# Patient Record
Sex: Male | Born: 1950 | Race: Black or African American | Hispanic: No | Marital: Single | Smoking: Former smoker
Health system: Southern US, Community
[De-identification: ages and names within clinical notes are randomized; demographics above are authoritative.]

## PROBLEM LIST (undated history)

## (undated) DIAGNOSIS — Z972 Presence of dental prosthetic device (complete) (partial): Secondary | ICD-10-CM

## (undated) DIAGNOSIS — Z8679 Personal history of other diseases of the circulatory system: Secondary | ICD-10-CM

## (undated) DIAGNOSIS — I10 Essential (primary) hypertension: Secondary | ICD-10-CM

## (undated) DIAGNOSIS — M1990 Unspecified osteoarthritis, unspecified site: Secondary | ICD-10-CM

## (undated) DIAGNOSIS — R3911 Hesitancy of micturition: Secondary | ICD-10-CM

## (undated) DIAGNOSIS — J302 Other seasonal allergic rhinitis: Secondary | ICD-10-CM

## (undated) DIAGNOSIS — E785 Hyperlipidemia, unspecified: Secondary | ICD-10-CM

## (undated) DIAGNOSIS — M5382 Other specified dorsopathies, cervical region: Secondary | ICD-10-CM

## (undated) DIAGNOSIS — I639 Cerebral infarction, unspecified: Secondary | ICD-10-CM

## (undated) DIAGNOSIS — IMO0002 Reserved for concepts with insufficient information to code with codable children: Secondary | ICD-10-CM

## (undated) HISTORY — DX: Reserved for concepts with insufficient information to code with codable children: IMO0002

## (undated) HISTORY — DX: Hyperlipidemia, unspecified: E78.5

## (undated) HISTORY — DX: Essential (primary) hypertension: I10

---

## 1998-05-21 ENCOUNTER — Inpatient Hospital Stay (HOSPITAL_COMMUNITY): Admission: EM | Admit: 1998-05-21 | Discharge: 1998-05-22 | Payer: Self-pay | Admitting: Emergency Medicine

## 1998-05-21 ENCOUNTER — Encounter: Payer: Self-pay | Admitting: Emergency Medicine

## 1998-06-06 ENCOUNTER — Encounter: Admission: RE | Admit: 1998-06-06 | Discharge: 1998-06-06 | Payer: Self-pay | Admitting: Family Medicine

## 1998-06-19 ENCOUNTER — Encounter: Admission: RE | Admit: 1998-06-19 | Discharge: 1998-06-19 | Payer: Self-pay | Admitting: Family Medicine

## 1998-07-11 ENCOUNTER — Ambulatory Visit (HOSPITAL_COMMUNITY): Admission: RE | Admit: 1998-07-11 | Discharge: 1998-07-11 | Payer: Self-pay | Admitting: Sports Medicine

## 1998-07-12 ENCOUNTER — Encounter: Payer: Self-pay | Admitting: Emergency Medicine

## 1998-07-12 ENCOUNTER — Emergency Department (HOSPITAL_COMMUNITY): Admission: EM | Admit: 1998-07-12 | Discharge: 1998-07-12 | Payer: Self-pay | Admitting: Emergency Medicine

## 2001-04-25 ENCOUNTER — Encounter: Payer: Self-pay | Admitting: Emergency Medicine

## 2001-04-25 ENCOUNTER — Emergency Department (HOSPITAL_COMMUNITY): Admission: EM | Admit: 2001-04-25 | Discharge: 2001-04-25 | Payer: Self-pay | Admitting: Emergency Medicine

## 2002-10-26 ENCOUNTER — Emergency Department (HOSPITAL_COMMUNITY): Admission: EM | Admit: 2002-10-26 | Discharge: 2002-10-26 | Payer: Self-pay | Admitting: Emergency Medicine

## 2003-02-28 ENCOUNTER — Emergency Department (HOSPITAL_COMMUNITY): Admission: EM | Admit: 2003-02-28 | Discharge: 2003-02-28 | Payer: Self-pay | Admitting: *Deleted

## 2003-02-28 ENCOUNTER — Encounter: Payer: Self-pay | Admitting: Emergency Medicine

## 2004-05-08 ENCOUNTER — Emergency Department (HOSPITAL_COMMUNITY): Admission: EM | Admit: 2004-05-08 | Discharge: 2004-05-08 | Payer: Self-pay | Admitting: Family Medicine

## 2005-10-29 DIAGNOSIS — Z87820 Personal history of traumatic brain injury: Secondary | ICD-10-CM

## 2005-10-29 DIAGNOSIS — Z8673 Personal history of transient ischemic attack (TIA), and cerebral infarction without residual deficits: Secondary | ICD-10-CM

## 2005-10-29 DIAGNOSIS — R079 Chest pain, unspecified: Secondary | ICD-10-CM | POA: Diagnosis present

## 2005-10-29 DIAGNOSIS — I639 Cerebral infarction, unspecified: Secondary | ICD-10-CM

## 2005-10-29 DIAGNOSIS — Z8679 Personal history of other diseases of the circulatory system: Secondary | ICD-10-CM

## 2005-10-29 HISTORY — DX: Personal history of traumatic brain injury: Z87.820

## 2005-10-29 HISTORY — DX: Cerebral infarction, unspecified: I63.9

## 2005-10-29 HISTORY — DX: Personal history of other diseases of the circulatory system: Z86.79

## 2005-11-08 ENCOUNTER — Inpatient Hospital Stay (HOSPITAL_COMMUNITY): Admission: AC | Admit: 2005-11-08 | Discharge: 2005-12-15 | Payer: Self-pay

## 2005-11-09 HISTORY — PX: INSERTION OF VENA CAVA FILTER: SHX5871

## 2005-11-16 HISTORY — PX: ANTERIOR CERVICAL DECOMP/DISCECTOMY FUSION: SHX1161

## 2005-11-17 ENCOUNTER — Encounter: Payer: Self-pay | Admitting: Vascular Surgery

## 2005-11-19 ENCOUNTER — Ambulatory Visit: Payer: Self-pay | Admitting: Physical Medicine & Rehabilitation

## 2006-10-26 ENCOUNTER — Encounter: Admission: RE | Admit: 2006-10-26 | Discharge: 2006-10-26 | Payer: Self-pay | Admitting: Cardiology

## 2006-11-18 ENCOUNTER — Encounter: Admission: RE | Admit: 2006-11-18 | Discharge: 2007-02-16 | Payer: Self-pay | Admitting: Cardiology

## 2007-01-13 ENCOUNTER — Ambulatory Visit (HOSPITAL_COMMUNITY): Admission: RE | Admit: 2007-01-13 | Discharge: 2007-01-13 | Payer: Self-pay | Admitting: Gastroenterology

## 2007-01-24 IMAGING — CR DG HUMERUS 2V *R*
3 series · 3 of 3 positions shown · non-contrast
Comparison: none

CLINICAL DATA: 54 year-old, right humeral pain
 RIGHT HUMERUS ? 2 VIEW:
 There is no evidence of fracture or other focal bone lesions.  Soft tissues are unremarkable.

[view not recorded (1 of 3)]
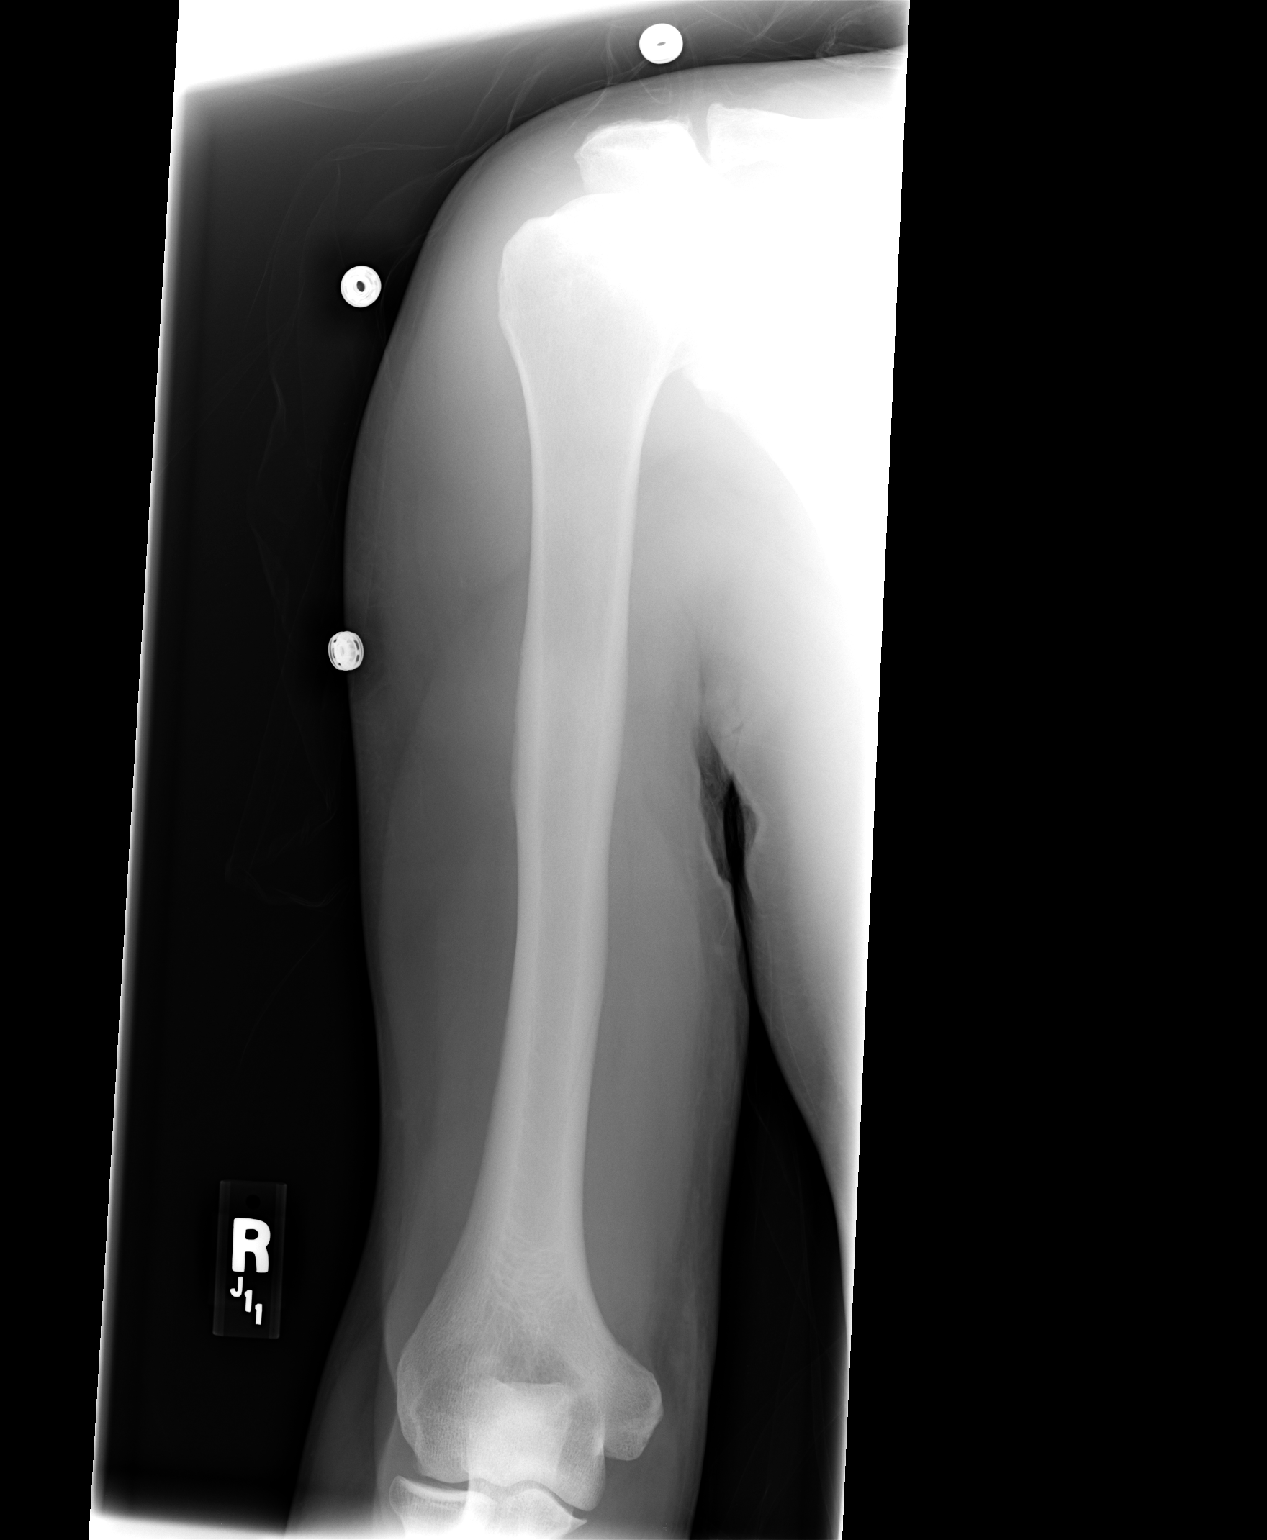

[view not recorded (2 of 3)]
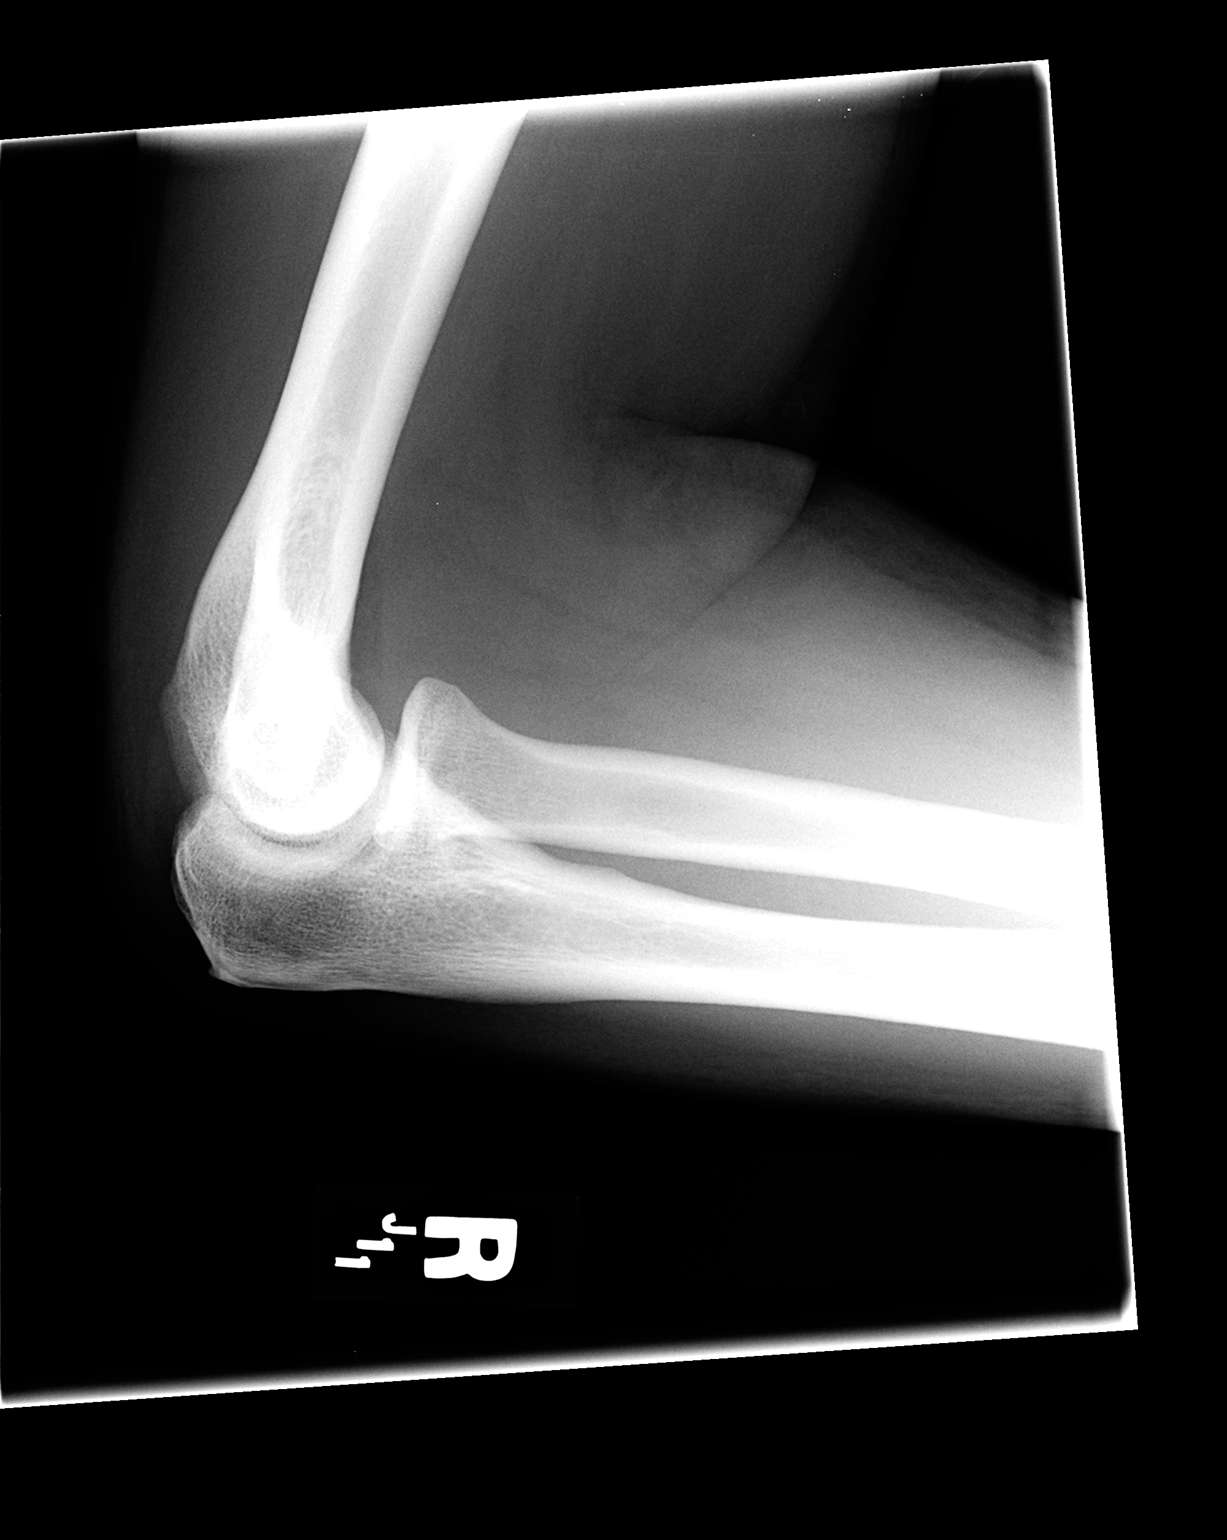

[view not recorded (3 of 3)]
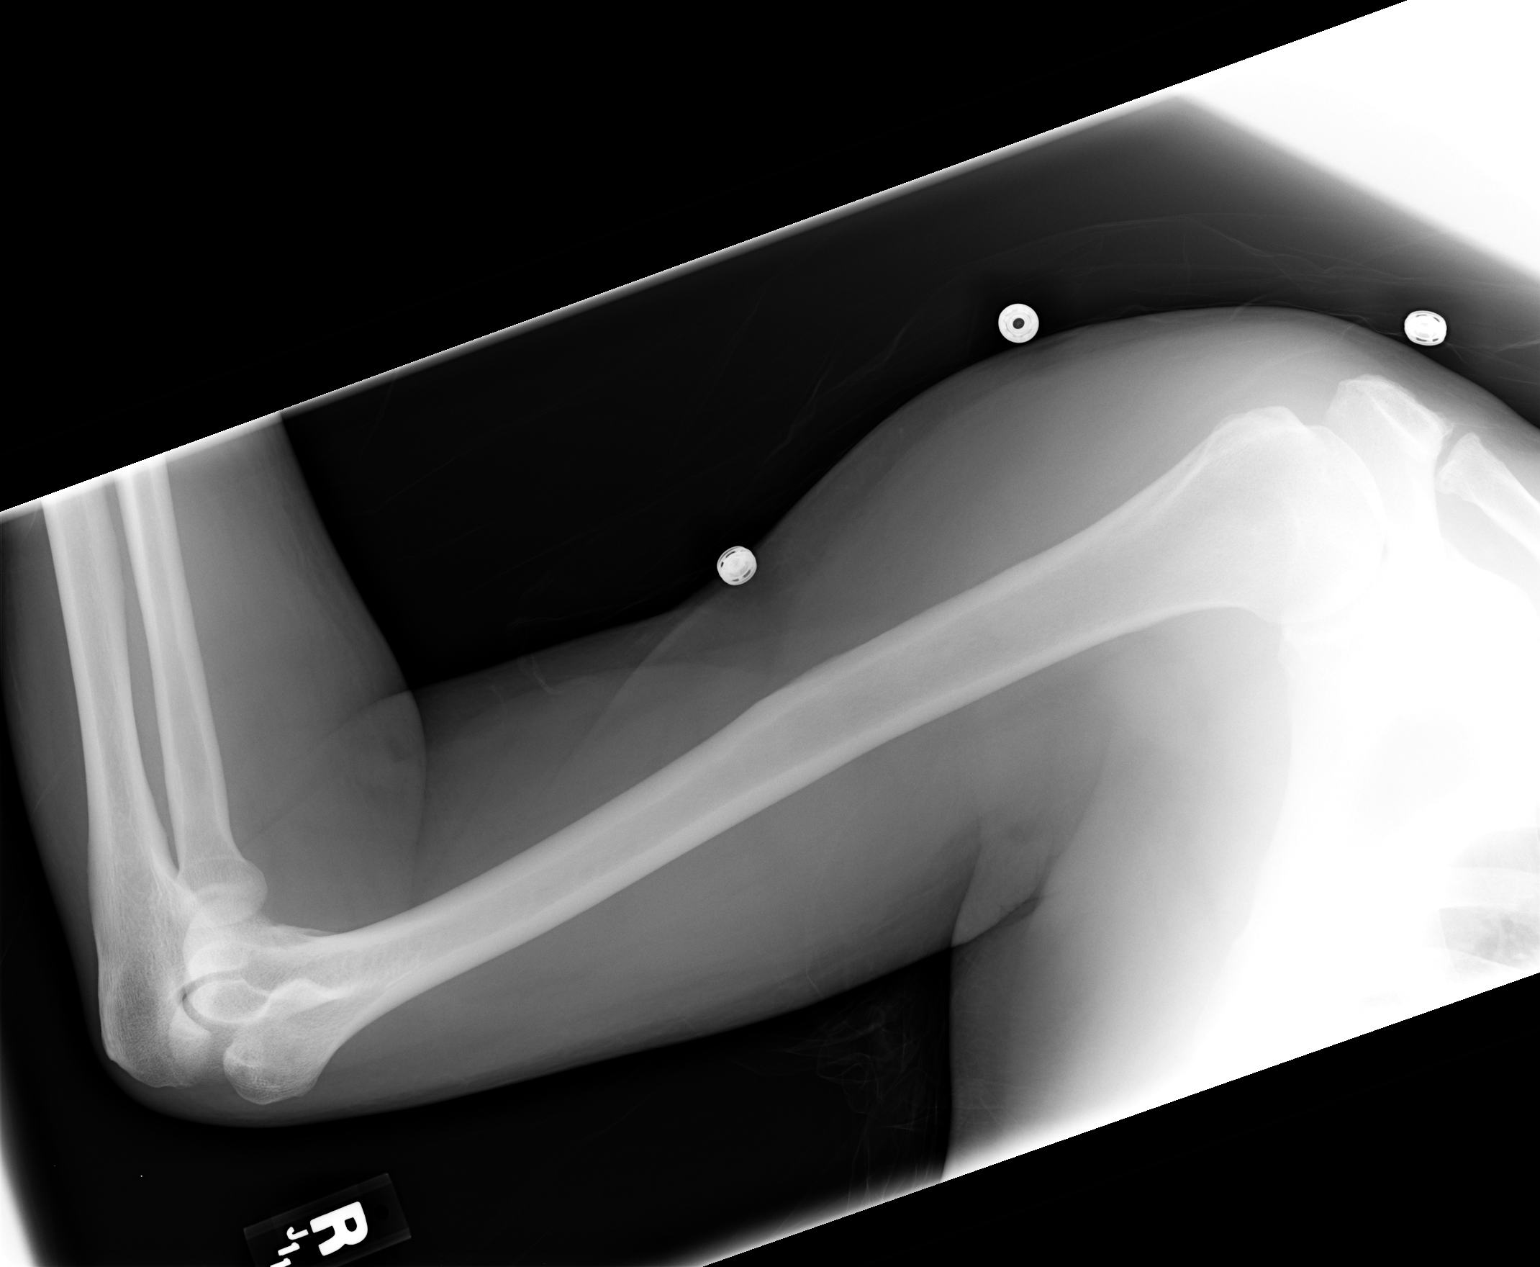

[3 of 3 positions shown; findings below may reference images not displayed]

IMPRESSION: Negative.
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
## 2007-03-10 ENCOUNTER — Encounter: Admission: RE | Admit: 2007-03-10 | Discharge: 2007-05-31 | Payer: Self-pay | Admitting: Cardiology

## 2007-04-28 ENCOUNTER — Other Ambulatory Visit: Payer: Self-pay

## 2007-04-28 ENCOUNTER — Inpatient Hospital Stay: Payer: Self-pay | Admitting: Internal Medicine

## 2007-08-10 ENCOUNTER — Encounter (INDEPENDENT_AMBULATORY_CARE_PROVIDER_SITE_OTHER): Payer: Self-pay | Admitting: Cardiology

## 2007-08-10 ENCOUNTER — Ambulatory Visit (HOSPITAL_COMMUNITY): Admission: RE | Admit: 2007-08-10 | Discharge: 2007-08-10 | Payer: Self-pay | Admitting: Cardiology

## 2007-08-10 ENCOUNTER — Ambulatory Visit: Payer: Self-pay | Admitting: Vascular Surgery

## 2008-07-08 ENCOUNTER — Ambulatory Visit: Payer: Self-pay | Admitting: Oncology

## 2008-08-29 ENCOUNTER — Ambulatory Visit: Payer: Self-pay | Admitting: Oncology

## 2008-09-18 ENCOUNTER — Ambulatory Visit: Payer: Self-pay | Admitting: Urology

## 2008-10-14 ENCOUNTER — Encounter: Payer: Self-pay | Admitting: Oncology

## 2008-10-14 ENCOUNTER — Other Ambulatory Visit: Admission: RE | Admit: 2008-10-14 | Discharge: 2008-10-14 | Payer: Self-pay | Admitting: Oncology

## 2008-10-14 ENCOUNTER — Ambulatory Visit: Payer: Self-pay | Admitting: Oncology

## 2008-10-14 LAB — CMP (CANCER CENTER ONLY)
ALT(SGPT): 16 U/L (ref 10–47)
Albumin: 3.8 g/dL (ref 3.3–5.5)
BUN, Bld: 11 mg/dL (ref 7–22)
CO2: 26 mEq/L (ref 18–33)
Calcium: 9.4 mg/dL (ref 8.0–10.3)
Chloride: 100 mEq/L (ref 98–108)
Creat: 1 mg/dl (ref 0.6–1.2)
Potassium: 4.3 mEq/L (ref 3.3–4.7)

## 2008-10-14 LAB — CBC WITH DIFFERENTIAL (CANCER CENTER ONLY)
BASO#: 0 10*3/uL (ref 0.0–0.2)
EOS%: 2.6 % (ref 0.0–7.0)
Eosinophils Absolute: 0.1 10*3/uL (ref 0.0–0.5)
HCT: 39.5 % (ref 38.7–49.9)
HGB: 13.6 g/dL (ref 13.0–17.1)
MCH: 30.1 pg (ref 28.0–33.4)
MCHC: 34.4 g/dL (ref 32.0–35.9)
MCV: 88 fL (ref 82–98)
MONO%: 9.6 % (ref 0.0–13.0)
NEUT#: 1.1 10*3/uL — ABNORMAL LOW (ref 1.5–6.5)
NEUT%: 34.1 % — ABNORMAL LOW (ref 40.0–80.0)
RBC: 4.51 10*6/uL (ref 4.20–5.70)

## 2008-10-14 LAB — MORPHOLOGY - CHCC SATELLITE: RBC Comments: NORMAL

## 2008-10-14 LAB — CHCC SATELLITE - SMEAR

## 2008-10-29 ENCOUNTER — Ambulatory Visit: Payer: Self-pay | Admitting: Oncology

## 2008-11-13 LAB — CBC WITH DIFFERENTIAL (CANCER CENTER ONLY)
BASO#: 0 10*3/uL (ref 0.0–0.2)
EOS%: 2.9 % (ref 0.0–7.0)
Eosinophils Absolute: 0.1 10*3/uL (ref 0.0–0.5)
HCT: 38.7 % (ref 38.7–49.9)
HGB: 13.3 g/dL (ref 13.0–17.1)
LYMPH#: 1.8 10*3/uL (ref 0.9–3.3)
MCHC: 34.4 g/dL (ref 32.0–35.9)
MONO#: 0.4 10*3/uL (ref 0.1–0.9)
NEUT#: 1.1 10*3/uL — ABNORMAL LOW (ref 1.5–6.5)
NEUT%: 32.2 % — ABNORMAL LOW (ref 40.0–80.0)
RBC: 4.48 10*6/uL (ref 4.20–5.70)
WBC: 3.4 10*3/uL — ABNORMAL LOW (ref 4.0–10.0)

## 2009-05-09 ENCOUNTER — Ambulatory Visit: Payer: Self-pay | Admitting: Oncology

## 2009-05-14 LAB — CBC WITH DIFFERENTIAL (CANCER CENTER ONLY)
BASO%: 0.6 % (ref 0.0–2.0)
EOS%: 3.1 % (ref 0.0–7.0)
HCT: 40.7 % (ref 38.7–49.9)
LYMPH#: 1.7 10*3/uL (ref 0.9–3.3)
MCHC: 32.7 g/dL (ref 32.0–35.9)
MONO#: 0.3 10*3/uL (ref 0.1–0.9)
NEUT#: 1.1 10*3/uL — ABNORMAL LOW (ref 1.5–6.5)
NEUT%: 35 % — ABNORMAL LOW (ref 40.0–80.0)
RDW: 13.7 % (ref 10.5–14.6)
WBC: 3.3 10*3/uL — ABNORMAL LOW (ref 4.0–10.0)

## 2009-12-09 ENCOUNTER — Ambulatory Visit: Payer: Self-pay | Admitting: Oncology

## 2010-01-09 ENCOUNTER — Ambulatory Visit: Payer: Self-pay | Admitting: Oncology

## 2010-01-15 LAB — CBC WITH DIFFERENTIAL (CANCER CENTER ONLY)
Eosinophils Absolute: 0.1 10*3/uL (ref 0.0–0.5)
LYMPH#: 0.8 10*3/uL — ABNORMAL LOW (ref 0.9–3.3)
MONO#: 0.8 10*3/uL (ref 0.1–0.9)
NEUT#: 7.7 10*3/uL — ABNORMAL HIGH (ref 1.5–6.5)
Platelets: 220 10*3/uL (ref 145–400)

## 2010-10-13 NOTE — Op Note (Signed)
NAMEAVIS, MCMAHILL                ACCOUNT NO.:  [id number]   MEDICAL RECORD NO.:  [id number]          PATIENT TYPE:  AMB   LOCATION:  ENDO                         FACILITY:  [facility]   PHYSICIAN:  Shirley Friar, MDDATE OF BIRTH:  09-26-1950   DATE OF PROCEDURE:  DATE OF DISCHARGE:                               OPERATIVE REPORT   INDICATIONS FOR PROCEDURE:  Screening.   MEDICATIONS:  Fentanyl 100 mcg IV, Versed 8 mg IV.   FINDINGS:  A rectal exam was normal. An adult Pentax colonoscope was  inserted  into an adequately prepped colon and advanced to the cecum and  the ileocecal valve and appendiceal orifice were identified. In order to  reach the cecum, abdominal pressure had to be applied. On careful  withdrawal of the colonoscope, no mucosal abnormalities were seen.  Retroflexion was unremarkable.   ASSESSMENT:  Normal colonoscopy.   PLAN:  Repeat colonoscopy in 10 years.      Shirley Friar, MD  Electronically Signed     VCS/MEDQ  D:  01/13/2007  T:  01/14/2007  Job:  [id number]
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
## 2010-10-16 NOTE — Op Note (Signed)
[name], Brandon                ACCOUNT NO.:  [id number]   MEDICAL RECORD NO.:  [id number]          PATIENT TYPE:  INP   LOCATION:  2106                         FACILITY:  [facility]   PHYSICIAN:  Payton Doughty, M.D.      DATE OF BIRTH:  [birth year]   DATE OF PROCEDURE:  11/16/2005  DATE OF DISCHARGE:                                 OPERATIVE REPORT   PREOPERATIVE DIAGNOSIS:  C5-6 traumatic disk with facet fracture.   POSTOPERATIVE DIAGNOSIS:  C5-6 traumatic disk with facet fracture.   OPERATIVE PROCEDURE:  C5-6 anterior cervical decompression and fusion with  Reflex hybrid plate.   SURGEON:  Payton Doughty, M.D.   NURSE ASSISTANT:  [facility].   DOCTOR ASSISTANT:  Clydene Fake, M.D.   ANESTHESIA:  General endotracheal.   PREPARATION:  Sterile Betadine prep and scrub with alcohol wipe.   COMPLICATIONS:  None.   BODY OF TEXT:  A 60 year old gentleman with C5-6 facet fracture and  traumatic disk disruption and with unstable spine.  Taken to the operating  room and smoothly anesthetized and intubated, placed supine on the operating  table.  Following shave, prep and drape in the usual sterile fashion, the  skin was incised from the midline to the medial border of the  sternocleidomastoid muscle on the left side.  The platysma was identified,  elevated, divided and undermined.  The sternocleidomastoid was identified  and medial dissection revealed the carotid artery retracted laterally to the  left, trachea and esophagus retracted laterally to the right, exposing the  bones of the anterior cervical spine.  A marker was placed, intraoperative x-  ray obtained to confirm correctness of level.  Having confirmed correctness  of level, the annulus was opened and the disk removed under gross  observation.  The end plates were curetted.  There was very gentle  distraction placed on the disk space, which allowed opening and the  microscope brought in for inspection of the anterior epidural  space.  The  posterior longitudinal ligament was explored and there was nothing behind  it.  It was felt nothing would be gained by removing the ligament, so it was  left.  An 8 mm bone graft was fashioned from patellar allograft and tapped  into place and a 14 mm Reflex hybrid plate was placed across it with 12 mm  screws, 2 in C5, 2 in C6.  The upper screws were used to interface between  the bone graft and the native bone at C5.  The wound was irrigated,  hemostasis assured.  X-ray confirmed good placement of plate and screws and  bone graft.  The platysma and subcutaneous tissues were reapproximated with  3-  0 Vicryl in interrupted fashion.  The skin was closed with 4-0 Vicryl in a  running subcuticular fashion.  Benzoin and Steri-Strips were placed and made  occlusive with Telfa and OpSite.  The patient placed in Aspen collar and  returned to the ICU still intubated.           ______________________________  Payton Doughty, M.D.  MWR/MEDQ  D:  11/16/2005  T:  11/17/2005  Job:  [id number]

## 2010-10-29 ENCOUNTER — Encounter: Payer: Self-pay | Admitting: Internal Medicine

## 2010-10-30 ENCOUNTER — Encounter: Payer: Self-pay | Admitting: Internal Medicine

## 2010-11-29 ENCOUNTER — Encounter: Payer: Self-pay | Admitting: Internal Medicine

## 2010-12-30 ENCOUNTER — Encounter: Payer: Self-pay | Admitting: Internal Medicine

## 2011-01-30 ENCOUNTER — Encounter: Payer: Self-pay | Admitting: Internal Medicine

## 2011-03-01 ENCOUNTER — Encounter: Payer: Self-pay | Admitting: Internal Medicine

## 2011-08-23 ENCOUNTER — Encounter: Payer: Self-pay | Admitting: Internal Medicine

## 2011-08-30 ENCOUNTER — Encounter: Payer: Self-pay | Admitting: Internal Medicine

## 2011-09-29 ENCOUNTER — Encounter: Payer: Self-pay | Admitting: Internal Medicine

## 2012-07-24 ENCOUNTER — Encounter: Payer: Self-pay | Admitting: Internal Medicine

## 2012-07-29 ENCOUNTER — Encounter: Payer: Self-pay | Admitting: Internal Medicine

## 2012-08-29 ENCOUNTER — Encounter: Payer: Self-pay | Admitting: Internal Medicine

## 2012-08-29 DIAGNOSIS — M1990 Unspecified osteoarthritis, unspecified site: Secondary | ICD-10-CM

## 2012-08-29 HISTORY — DX: Unspecified osteoarthritis, unspecified site: M19.90

## 2012-09-18 ENCOUNTER — Other Ambulatory Visit: Payer: Self-pay | Admitting: Orthopedic Surgery

## 2012-09-21 ENCOUNTER — Encounter (HOSPITAL_BASED_OUTPATIENT_CLINIC_OR_DEPARTMENT_OTHER): Payer: Self-pay | Admitting: *Deleted

## 2012-09-22 NOTE — Pre-Procedure Instructions (Signed)
History discussed with Dr. Crews; pt. OK to come for surgery 

## 2012-09-25 NOTE — H&P (Signed)
Brandon [name] is an 62 y.o. male.   CC / Reason for Visit: Left wrist pain HPI: This patient is a 62 year old male who presents for evaluation of left wrist pain.  He has severe right-sided weakness, making his left arm primary.  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  More recently it has been troublesome using a cane, etc. in the left hand.  He has had the wrist injected more than once in the past and finds that it is helpful but becoming less so and for shorter periods of time.  Past Medical History  Diagnosis Date  . Seasonal allergies   . Urinary hesitancy   . 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 Decreased range of motion of intervertebral discs of cervical spine     due to fusion C5-6  . Wears dentures     upper  . History of epidural hemorrhage 10/2005    tra[facility] from [facility]    Past Surgical History  Procedure Laterality Date  . Anterior cervical decomp/discectomy fusion  11/16/2005    C5-6  . Insertion of vena cava filter  11/09/2005    History reviewed. No pertinent family history. Social History:  reports that he has quit smoking. He has never used smokeless tobacco. He reports that  drinks alcohol. He reports that he does not use illicit drugs.  Allergies: No Known Allergies  No prescriptions prior to admission    No results found for this or any previous visit (from the past 48 hour(s)). No results found.  Review of Systems  All other systems reviewed and are negative.    Height 6' (1.829 m), weight 97.523 kg (215 lb). Physical Exam  Constitutional:  WD, WN, NAD HEENT:  NCAT, EOMI Neuro/Psych:  Alert & oriented to person, place, and time; appropriate mood & affect Lymphatic: No generalized UE edema or lymphadenopathy Extremities / MSK:   Both UE are normal with respect to appearance, ranges of motion, joint stability, muscle strength/tone, sensation, & perfusion except as otherwise noted:  Left wrist is slightly enlarged radially with visible and palpable radial osteophytes.  Wrist flexion 50, extension 35, grip strength in position 2:65.  The right upper extremity is paralytic and has minimal volitional motor control.  Labs / Xrays: 3 views of the left wrist ordered and obtained today reveals SLAC arthritis, stage 3-4.  Assessment:  SLAC arthritis of the left wrist  Plan:  I discussed these findings with him.  We discussed the details of proximal row carpectomy versus limited wrist fusion.  I think it is possible that he has degenerative change involving the head of the capitate, which would make the limited fusion a better option than a proximal row carpectomy.  I showed him x-rays of this procedure.  I will work with therapy to see how we might be able to construct a device such as a crutch or a walker with a platform so that he could use it while his wrist was recovering without having to place weight directly upon the wrist.  Questions were invited and answered.  Bich Mchaney A. 09/25/2012, 6:30 PM

## 2012-09-26 NOTE — H&P (Signed)
Brandon [name] is an 62 y.o. male.   CC / Reason for Visit: Left wrist pain HPI: This patient is a 62 year old male who presents for evaluation of left wrist pain.  He has severe right-sided weakness, making his left arm primary.  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  More recently it has been troublesome using a cane, etc. in the left hand.  He has had the wrist injected more than once in the past and finds that it is helpful but becoming less so and for shorter periods of time.   Past Medical History  Diagnosis Date  . Seasonal allergies   . Urinary hesitancy   . 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 Decreased range of motion of intervertebral discs of cervical spine     due to fusion C5-6  . Wears dentures     upper  . History of epidural hemorrhage 10/2005    trauma from [facility]    Past Surgical History  Procedure Laterality Date  . Anterior cervical decomp/discectomy fusion  11/16/2005    C5-6  . Insertion of vena cava filter  11/09/2005    History reviewed. No pertinent family history. Social History:  reports that he has quit smoking. He has never used smokeless tobacco. He reports that  drinks alcohol. He reports that he does not use illicit drugs.  Allergies: No Known Allergies  No prescriptions prior to admission    No results found for this or any previous visit (from the past 48 hour(s)). No results found.  Review of Systems  All other systems reviewed and are negative.    Height 6' (1.829 m), weight 97.523 kg (215 lb). Physical Exam  Constitutional:  WD, WN, NAD HEENT:  NCAT, EOMI Neuro/Psych:  Alert & oriented to person, place, and time; appropriate mood & affect Lymphatic: No generalized UE edema or lymphadenopathy Extremities /  MSK:  Both UE are normal with respect to appearance, ranges of motion, joint stability, muscle strength/tone, sensation, & perfusion except as otherwise noted:  Left wrist is slightly enlarged radially with visible and palpable radial osteophytes.  Wrist flexion 50, extension 35, grip strength in position 2:65.  The right upper extremity is paralytic and has minimal volitional motor control.  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  Assessment:  SLAC arthritis of the left wrist  Plan:  I discussed these findings with him.  We discussed the details of proximal row carpectomy versus limited wrist fusion.  I think it is possible that he has degenerative change involving the head of the capitate, which would make the limited fusion a better option than a proximal row carpectomy.  I showed him x-rays of this procedure.  I will work with therapy to see how we might be able to construct a device such as a crutch or a walker with a platform so that he could use it while his wrist was recovering without having to place weight directly upon the wrist.  Questions were invited and answered  Brandon [name] A. 09/26/2012, 1:27 PM

## 2012-09-27 ENCOUNTER — Ambulatory Visit (HOSPITAL_BASED_OUTPATIENT_CLINIC_OR_DEPARTMENT_OTHER)
Admission: RE | Admit: 2012-09-27 | Discharge: 2012-09-27 | Disposition: A | Discharge status: Home / Self Care | Payer: Medicaid Other | Source: Ambulatory Visit | Attending: Orthopedic Surgery | Admitting: Orthopedic Surgery

## 2012-09-27 ENCOUNTER — Encounter (HOSPITAL_BASED_OUTPATIENT_CLINIC_OR_DEPARTMENT_OTHER): Payer: Self-pay | Admitting: Anesthesiology

## 2012-09-27 ENCOUNTER — Encounter (HOSPITAL_BASED_OUTPATIENT_CLINIC_OR_DEPARTMENT_OTHER)
Admission: RE | Disposition: A | Discharge status: Home / Self Care | Payer: Self-pay | Source: Ambulatory Visit | Attending: Orthopedic Surgery

## 2012-09-27 ENCOUNTER — Ambulatory Visit (HOSPITAL_BASED_OUTPATIENT_CLINIC_OR_DEPARTMENT_OTHER): Payer: Medicaid Other | Admitting: Anesthesiology

## 2012-09-27 ENCOUNTER — Ambulatory Visit (HOSPITAL_COMMUNITY): Payer: Medicaid Other

## 2012-09-27 ENCOUNTER — Encounter (HOSPITAL_BASED_OUTPATIENT_CLINIC_OR_DEPARTMENT_OTHER): Payer: Self-pay | Admitting: *Deleted

## 2012-09-27 DIAGNOSIS — Z8673 Personal history of transient ischemic attack (TIA), and cerebral infarction without residual deficits: Secondary | ICD-10-CM | POA: Insufficient documentation

## 2012-09-27 DIAGNOSIS — M19039 Primary osteoarthritis, unspecified wrist: Secondary | ICD-10-CM | POA: Insufficient documentation

## 2012-09-27 HISTORY — PX: WRIST FUSION WITH ILIAC CREST BONE GRAFT: SHX5682

## 2012-09-27 HISTORY — DX: Hesitancy of micturition: R39.11

## 2012-09-27 HISTORY — DX: Presence of dental prosthetic device (complete) (partial): Z97.2

## 2012-09-27 HISTORY — DX: Personal history of other diseases of the circulatory system: Z86.79

## 2012-09-27 HISTORY — DX: Cerebral infarction, unspecified: I63.9

## 2012-09-27 HISTORY — DX: Other seasonal allergic rhinitis: J30.2

## 2012-09-27 HISTORY — DX: Other specified dorsopathies, cervical region: M53.82

## 2012-09-27 HISTORY — DX: Unspecified osteoarthritis, unspecified site: M19.90

## 2012-09-27 SURGERY — WRIST FUSION WITH ILIAC CREST BONE GRAFT
Anesthesia: General | Site: Wrist | Laterality: Left | Wound class: Clean

## 2012-09-27 MED ORDER — LACTATED RINGERS IV SOLN
INTRAVENOUS | Status: DC
  Administered 2012-09-27: 09:00:00 via INTRAVENOUS

## 2012-09-27 MED ORDER — FENTANYL CITRATE 0.05 MG/ML IJ SOLN: 50.0000 ug | INTRAMUSCULAR | Status: DC | PRN

## 2012-09-27 MED ORDER — LACTATED RINGERS IV SOLN: INTRAVENOUS | Status: DC

## 2012-09-27 MED ORDER — LIDOCAINE-EPINEPHRINE (PF) 1 %-1:200000 IJ SOLN
INTRAMUSCULAR | Status: DC | PRN
  Administered 2012-09-27: 09:00:00 via INTRAMUSCULAR

## 2012-09-27 MED ORDER — MIDAZOLAM HCL 2 MG/ML PO SYRP: 12.0000 mg | ORAL_SOLUTION | Freq: Once | ORAL | Status: DC | PRN

## 2012-09-27 MED ORDER — 0.9 % SODIUM CHLORIDE (POUR BTL) OPTIME
TOPICAL | Status: DC | PRN
  Administered 2012-09-27: 200 mL

## 2012-09-27 MED ORDER — HYDROMORPHONE HCL PF 1 MG/ML IJ SOLN: 0.2500 mg | INTRAMUSCULAR | Status: DC | PRN

## 2012-09-27 MED ORDER — OXYCODONE HCL 5 MG/5ML PO SOLN: 5.0000 mg | Freq: Once | ORAL | Status: DC | PRN

## 2012-09-27 MED ORDER — LIDOCAINE HCL (CARDIAC) 20 MG/ML IV SOLN
INTRAVENOUS | Status: DC | PRN
  Administered 2012-09-27: 80 mg via INTRAVENOUS

## 2012-09-27 MED ORDER — HYDROCODONE-ACETAMINOPHEN 5-325 MG PO TABS: 1.0000 | ORAL_TABLET | ORAL | Status: DC | PRN

## 2012-09-27 MED ORDER — CEFAZOLIN SODIUM-DEXTROSE 2-3 GM-% IV SOLR
2.0000 g | INTRAVENOUS | Status: AC
  Administered 2012-09-27: 2 g via INTRAVENOUS

## 2012-09-27 MED ORDER — OXYCODONE HCL 5 MG PO TABS: 5.0000 mg | ORAL_TABLET | Freq: Once | ORAL | Status: DC | PRN

## 2012-09-27 MED ORDER — ONDANSETRON HCL 4 MG/2ML IJ SOLN
INTRAMUSCULAR | Status: DC | PRN
  Administered 2012-09-27: 4 mg via INTRAVENOUS

## 2012-09-27 MED ORDER — FENTANYL CITRATE 0.05 MG/ML IJ SOLN
INTRAMUSCULAR | Status: DC | PRN
  Administered 2012-09-27: 100 ug via INTRAVENOUS
  Administered 2012-09-27 (×2): 25 ug via INTRAVENOUS

## 2012-09-27 MED ORDER — MIDAZOLAM HCL 2 MG/2ML IJ SOLN: 1.0000 mg | INTRAMUSCULAR | Status: DC | PRN

## 2012-09-27 MED ORDER — PROPOFOL 10 MG/ML IV BOLUS
INTRAVENOUS | Status: DC | PRN
  Administered 2012-09-27: 200 mg via INTRAVENOUS

## 2012-09-27 MED ORDER — EPHEDRINE SULFATE 50 MG/ML IJ SOLN
INTRAMUSCULAR | Status: DC | PRN
  Administered 2012-09-27: 10 mg via INTRAVENOUS

## 2012-09-27 MED ORDER — GLYCOPYRROLATE 0.2 MG/ML IJ SOLN
INTRAMUSCULAR | Status: DC | PRN
  Administered 2012-09-27: 0.2 mg via INTRAVENOUS

## 2012-09-27 MED ORDER — DEXAMETHASONE SODIUM PHOSPHATE 4 MG/ML IJ SOLN
INTRAMUSCULAR | Status: DC | PRN
  Administered 2012-09-27: 10 mg via INTRAVENOUS

## 2012-09-27 MED ORDER — LACTATED RINGERS IV SOLN
INTRAVENOUS | Status: DC | PRN
  Administered 2012-09-27: 09:00:00 via INTRAVENOUS

## 2012-09-27 MED ORDER — ONDANSETRON HCL 4 MG/2ML IJ SOLN: 4.0000 mg | Freq: Once | INTRAMUSCULAR | Status: DC | PRN

## 2012-09-27 SURGICAL SUPPLY — 59 items
BANDAGE GAUZE ELAST BULKY 4 IN (GAUZE/BANDAGES/DRESSINGS) ×4 IMPLANT
BIT DRILL MINI LNG ACUTRAK 2 (BIT) ×1 IMPLANT
BLADE AVERAGE 25X9 (BLADE) IMPLANT
BLADE MINI RND TIP GREEN BEAV (BLADE) ×2 IMPLANT
BLADE SURG 15 STRL LF DISP TIS (BLADE) ×1 IMPLANT
BLADE SURG 15 STRL SS (BLADE) ×1
BNDG COHESIVE 4X5 TAN STRL (GAUZE/BANDAGES/DRESSINGS) ×2 IMPLANT
BNDG ESMARK 4X9 LF (GAUZE/BANDAGES/DRESSINGS) ×2 IMPLANT
BUR ROUND CARBIDE (BURR) IMPLANT
CHLORAPREP W/TINT 26ML (MISCELLANEOUS) ×2 IMPLANT
CORDS BIPOLAR (ELECTRODE) ×2 IMPLANT
COVER MAYO STAND STRL (DRAPES) ×2 IMPLANT
COVER TABLE BACK 60X90 (DRAPES) ×2 IMPLANT
CUFF TOURNIQUET SINGLE 18IN (TOURNIQUET CUFF) ×2 IMPLANT
DRAPE C-ARM 42X72 X-RAY (DRAPES) ×2 IMPLANT
DRAPE EXTREMITY T 121X128X90 (DRAPE) ×2 IMPLANT
DRAPE SURG 17X23 STRL (DRAPES) ×2 IMPLANT
DRILL MINI LNG ACUTRAK 2 (BIT) ×2
DRSG EMULSION OIL 3X3 NADH (GAUZE/BANDAGES/DRESSINGS) ×2 IMPLANT
ELECT REM PT RETURN 9FT ADLT (ELECTROSURGICAL)
ELECTRODE REM PT RTRN 9FT ADLT (ELECTROSURGICAL) IMPLANT
GLOVE BIO SURGEON STRL SZ7.5 (GLOVE) ×2 IMPLANT
GLOVE BIOGEL PI IND STRL 7.0 (GLOVE) ×1 IMPLANT
GLOVE BIOGEL PI IND STRL 8 (GLOVE) ×1 IMPLANT
GLOVE BIOGEL PI INDICATOR 7.0 (GLOVE) ×1
GLOVE BIOGEL PI INDICATOR 8 (GLOVE) ×1
GLOVE ECLIPSE 6.5 STRL STRAW (GLOVE) ×2 IMPLANT
GLOVE EXAM NITRILE MD LF STRL (GLOVE) ×2 IMPLANT
GOWN PREVENTION PLUS XLARGE (GOWN DISPOSABLE) ×4 IMPLANT
GUIDEWIRE ORTHO MINI ACTK .045 (WIRE) ×4 IMPLANT
K-WIRE .062X4 (WIRE) ×2 IMPLANT
LOOP VESSEL MINI RED (MISCELLANEOUS) IMPLANT
NEEDLE HYPO 22GX1.5 SAFETY (NEEDLE) IMPLANT
NS IRRIG 1000ML POUR BTL (IV SOLUTION) ×2 IMPLANT
PACK BASIN DAY SURGERY FS (CUSTOM PROCEDURE TRAY) ×2 IMPLANT
PADDING CAST ABS 4INX4YD NS (CAST SUPPLIES) ×1
PADDING CAST ABS COTTON 4X4 ST (CAST SUPPLIES) ×1 IMPLANT
PENCIL BUTTON HOLSTER BLD 10FT (ELECTRODE) IMPLANT
SCREW ACUTRAK 2 MINI 16MM (Screw) ×2 IMPLANT
SCREW ACUTRAK 2 MINI 22MM (Screw) ×4 IMPLANT
SCREW ACUTRAK 2 MINI 26MM (Screw) ×2 IMPLANT
SLEEVE SCD COMPRESS KNEE MED (MISCELLANEOUS) ×2 IMPLANT
SPLINT PLASTER CAST XFAST 3X15 (CAST SUPPLIES) IMPLANT
SPLINT PLASTER CAST XFAST 4X15 (CAST SUPPLIES) ×10 IMPLANT
SPLINT PLASTER XTRA FAST SET 4 (CAST SUPPLIES) ×10
SPLINT PLASTER XTRA FASTSET 3X (CAST SUPPLIES)
SPONGE GAUZE 4X4 12PLY (GAUZE/BANDAGES/DRESSINGS) ×2 IMPLANT
STOCKINETTE 4X48 STRL (DRAPES) ×2 IMPLANT
SUT STEEL 2 (SUTURE) IMPLANT
SUT STEEL 4 (SUTURE) IMPLANT
SUT STEEL 5 (SUTURE) IMPLANT
SUT VICRYL 3-0 RB1 (SUTURE) ×2 IMPLANT
SUT VICRYL AB 4 0 18 (SUTURE) IMPLANT
SUT VICRYL RAPIDE 4/0 PS 2 (SUTURE) ×2 IMPLANT
SYR BULB 3OZ (MISCELLANEOUS) ×2 IMPLANT
SYRINGE 10CC LL (SYRINGE) ×2 IMPLANT
TOWEL OR 17X24 6PK STRL BLUE (TOWEL DISPOSABLE) ×2 IMPLANT
TOWEL OR NON WOVEN STRL DISP B (DISPOSABLE) ×2 IMPLANT
UNDERPAD 30X30 INCONTINENT (UNDERPADS AND DIAPERS) ×2 IMPLANT

## 2012-09-27 NOTE — Anesthesia Postprocedure Evaluation (Signed)
  Anesthesia Post-op Note  Patient: Brandon [name]  Procedure(s) Performed: Procedure(s): LEFT WRIST SCAPHOID EXCISION WITH PARTIAL FUSION (Left)  Patient Location: PACU  Anesthesia Type:General  Level of Consciousness: awake, alert  and oriented  Airway and Oxygen Therapy: Patient Spontanous Breathing and Patient connected to face mask oxygen  Post-op Pain: mild  Post-op Assessment: Post-op Vital signs reviewed  Post-op Vital Signs: Reviewed  Complications: No apparent anesthesia complications

## 2012-09-27 NOTE — Op Note (Signed)
09/27/2012  9:10 AM  PATIENT:  Brandon [name]  62 y.o. male  PRE-OPERATIVE DIAGNOSIS:  Left wrist secondary arthritis (SLAC wrist)  POST-OPERATIVE DIAGNOSIS:  Same  PROCEDURE:  Left wrist scaphoid excision, radial styloidectomy, and mid-carpal fusion (C-L & T-H)  SURGEON: Cliffton Asters. Janee Morn, MD  PHYSICIAN ASSISTANT: None  ANESTHESIA:  general  SPECIMENS:  None  DRAINS:   None  PREOPERATIVE INDICATIONS:  Brandon [name] is a  62 y.o. male with a diagnosis of left wrist SLAC secondary arthritis who failed conservative measures and elected for surgical management to decrease pain and improve function  The risks benefits and alternatives were discussed with the patient preoperatively including but not limited to the risks of infection, bleeding, nerve injury, cardiopulmonary complications, the need for revision surgery, among others, and the patient verbalized understanding and consented to proceed.  OPERATIVE IMPLANTS: mini-acutrak screws x 3  OPERATIVE FINDINGS: There was advanced bone-on-bone arthritis in the radial fossa, as well as the capital and a joint. The radiolunate joint had [name] cartilage remaining. A [name] midcarpal alignment was reestablished with provider for slightly greater extension than flexion.  OPERATIVE PROCEDURE:  After receiving prophylactic antibiotics, the patient was escorted to the operative theatre and placed in a supine position. General anesthesia was administered. A surgical "time-out" was performed during which the planned procedure, proposed operative site, and the correct patient identity were compared to the operative consent and agreement confirmed by the circulating nurse according to current facility policy. Local anesthetic mixture consisting of lidocaine and Marcaine bearing epinephrine was instilled into the region of the planned incision. Following application of a tourniquet to the operative extremity, the exposed skin was prepped with Chloraprep and  draped in the usual sterile fashion.  The limb was exsanguinated with an Esmarch bandage and the tourniquet inflated to approximately higher than systolic BP.  A 3 limb zigzag incision centered over the dorsal midline of the wrist was made sharply with a scalpel. Ultimately this was extended into a fourth limb. The skin was incised sharply with a scalpel a full-thickness flap was elevated. The extensor retinaculum was split along the ulnar margin of the EPL tendon leaving remnant upon the Lister's tubercle for repair. The EPL was transposed radially. The fourth compartment was lifted ulnarly with sharp dissection leaving the tendons fully undisturbed. There was bone within the dorsal capsule and this was excised. The dorsal capsule could not be split in a ligament sparing fashion and ultimately wanted becoming more of a T-shaped arthrotomy. This exposed the joint. The scaphoid was removed piecemeal. Radial styloidectomy was performed preserving the radioscaphocapitate ligament attachments on the radius. The lunate was found to be extremely dorsiflexed. The capitolunate joint and triquetral hamate joint were prepared for fusion with a combination of osteotomes, curettes, and rongeurs down to bleeding cancellous bone. There was still some cartilage remaining in the triquetral hamate joint but no cartilage remaining at the capitolunate joint. Once surfaces were prepared for fusion, the lunate was secured with a dorsal K wire used as a joystick to help change its alignment. It was brought into neutral to very slight flexion and pinned temporarily to the radius. The capitate was then reduced to the lunate and secured with a K wire. Once satisfied with this realignment, 2 different mini Acufex screws were placed from the lunate into the capitate. This secured the joint very nicely. The K wires were all removed and the carpus moved to the radiolunate joint, with no midcarpal motion. One  additional mini Acutrak  screw was placed across the triquetral hamate joint. The wounds were copiously irrigated and final fluoroscopic images obtained. The capsule was closed with 3-0 Vicryl suture which was also used to close retinaculum return the EPL to its native bed. Tourniquet was released and additional hemostasis obtained and the skin was closed with 3-0 Vicryl deep dermal buried sutures and a running 4-0 Vicryl Rapide horizontal mattress suture. A bulky splint dressing was applied with a volar plaster component and the patient was taken to the recovery room  Disposition patient discharged home today with typical postop instructions returning in 10-15 days for reevaluation. He should have x-rays of the left wrist at that time out of the splint, 3 views, and be placed into a short arm cast.

## 2012-09-27 NOTE — Transfer of Care (Signed)
Immediate Anesthesia Transfer of Care Note  Patient: Brandon [name]  Procedure(s) Performed: Procedure(s): LEFT WRIST SCAPHOID EXCISION WITH PARTIAL FUSION (Left)  Patient Location: PACU  Anesthesia Type:General  Level of Consciousness: sedated and patient cooperative  Airway & Oxygen Therapy: Patient Spontanous Breathing and Patient connected to face mask oxygen  Post-op Assessment: Report given to PACU RN and Post -op Vital signs reviewed and stable  Post vital signs: Reviewed and stable  Complications: No apparent anesthesia complications

## 2012-09-27 NOTE — Interval H&P Note (Signed)
History and Physical Interval Note:  09/27/2012 9:07 AM  Brandon [name]  has presented today for surgery, with the diagnosis of LEFT WRIST ARTHRITIS  The various methods of treatment have been discussed with the patient and family. After consideration of risks, benefits and other options for treatment, the patient has consented to  Procedure(s): LEFT WRIST SCAPHOID EXCISION WITH PARTIAL FUSION (Left) as a surgical intervention .  The patient's history has been reviewed, patient examined, no change in status, stable for surgery.  I have reviewed the patient's chart and labs.  Questions were answered to the patient's satisfaction.     [name] A.

## 2012-09-27 NOTE — Anesthesia Preprocedure Evaluation (Signed)
Anesthesia Evaluation  Patient identified by MRN, date of birth, ID band Patient awake    Reviewed: Allergy & Precautions, H&P , NPO status , Patient's Chart, lab work & pertinent test results  Airway Mallampati: I TM Distance: >3 FB Neck ROM: Full    Dental  (+) Teeth Intact, Upper Dentures and Dental Advisory Given   Pulmonary  breath sounds clear to auscultation        Cardiovascular Rhythm:Regular Rate:Normal     Neuro/Psych    GI/Hepatic   Endo/Other    Renal/GU      Musculoskeletal   Abdominal   Peds  Hematology   Anesthesia Other Findings   Reproductive/Obstetrics                           Anesthesia Physical Anesthesia Plan  ASA: III  Anesthesia Plan: General   Post-op Pain Management:    Induction: Intravenous  Airway Management Planned: LMA  Additional Equipment:   Intra-op Plan:   Post-operative Plan: Extubation in OR  Informed Consent: I have reviewed the patients History and Physical, chart, labs and discussed the procedure including the risks, benefits and alternatives for the proposed anesthesia with the patient or authorized representative who has indicated his/her understanding and acceptance.   Dental advisory given  Plan Discussed with: CRNA, Anesthesiologist and Surgeon  Anesthesia Plan Comments:         Anesthesia Quick Evaluation

## 2012-09-27 NOTE — Anesthesia Procedure Notes (Addendum)
Procedure Name: LMA Insertion Date/Time: 09/27/2012 9:24 AM Performed by: Gar Gibbon Pre-anesthesia Checklist: Patient identified, Emergency Drugs available, Suction available and Patient being monitored Patient Re-evaluated:Patient Re-evaluated prior to inductionOxygen Delivery Method: Circle System Utilized Preoxygenation: Pre-oxygenation with 100% oxygen Intubation Type: IV induction Ventilation: Mask ventilation without difficulty LMA: LMA with gastric port inserted LMA Size: 5.0 Number of attempts: 1 Placement Confirmation: positive ETCO2 Tube secured with: Tape Dental Injury: Teeth and Oropharynx as per pre-operative assessment    Anesthesia Regional Block:   Narrative:

## 2012-09-28 ENCOUNTER — Encounter (HOSPITAL_BASED_OUTPATIENT_CLINIC_OR_DEPARTMENT_OTHER): Payer: Self-pay | Admitting: Orthopedic Surgery

## 2012-11-15 ENCOUNTER — Encounter: Payer: Self-pay | Admitting: Orthopedic Surgery

## 2013-04-05 ENCOUNTER — Other Ambulatory Visit: Payer: Self-pay

## 2013-09-01 ENCOUNTER — Emergency Department: Payer: Self-pay | Admitting: Emergency Medicine

## 2013-09-01 LAB — CBC
HCT: 38.7 % — AB (ref 40.0–52.0)
HGB: 12.6 g/dL — ABNORMAL LOW (ref 13.0–18.0)
MCH: 29.3 pg (ref 26.0–34.0)
MCHC: 32.5 g/dL (ref 32.0–36.0)
MCV: 90 fL (ref 80–100)
Platelet: 186 10*3/uL (ref 150–440)
RBC: 4.3 10*6/uL — AB (ref 4.40–5.90)
RDW: 14.6 % — ABNORMAL HIGH (ref 11.5–14.5)
WBC: 3.6 10*3/uL — AB (ref 3.8–10.6)

## 2013-09-01 LAB — COMPREHENSIVE METABOLIC PANEL
ANION GAP: 7 (ref 7–16)
Albumin: 3.7 g/dL (ref 3.4–5.0)
Alkaline Phosphatase: 70 U/L
BUN: 10 mg/dL (ref 7–18)
Bilirubin,Total: 0.5 mg/dL (ref 0.2–1.0)
Calcium, Total: 8.8 mg/dL (ref 8.5–10.1)
Chloride: 104 mmol/L (ref 98–107)
Co2: 24 mmol/L (ref 21–32)
Creatinine: 0.84 mg/dL (ref 0.60–1.30)
EGFR (African American): >60
GLUCOSE: 89 mg/dL (ref 65–99)
OSMOLALITY: 269 (ref 275–301)
POTASSIUM: 4.2 mmol/L (ref 3.5–5.1)
SGOT(AST): 34 U/L (ref 15–37)
SGPT (ALT): 18 U/L (ref 12–78)
SODIUM: 135 mmol/L — AB (ref 136–145)
Total Protein: 7.5 g/dL (ref 6.4–8.2)

## 2013-09-01 LAB — PRO B NATRIURETIC PEPTIDE: B-TYPE NATIURETIC PEPTID: 73 pg/mL (ref 0–125)

## 2013-09-01 LAB — TROPONIN I: Troponin-I: <0.02 ng/mL

## 2013-09-04 ENCOUNTER — Encounter: Payer: Self-pay | Admitting: *Deleted

## 2013-09-05 ENCOUNTER — Ambulatory Visit: Payer: Medicaid Other | Admitting: Cardiology

## 2014-09-18 ENCOUNTER — Ambulatory Visit
Admit: 2014-09-18 | Disposition: A | Discharge status: Home / Self Care | Payer: Self-pay | Attending: Oncology | Admitting: Oncology

## 2014-09-18 LAB — CBC CANCER CENTER
BASOS PCT: 0.4 %
Basophil #: 0 x10 3/mm (ref 0.0–0.1)
EOS ABS: 0.1 x10 3/mm (ref 0.0–0.7)
Eosinophil %: 3.3 %
HCT: 41.4 % (ref 40.0–52.0)
HGB: 13.5 g/dL (ref 13.0–18.0)
Lymphocyte #: 1.6 x10 3/mm (ref 1.0–3.6)
Lymphocyte %: 44.1 %
MCH: 29.5 pg (ref 26.0–34.0)
MCHC: 32.7 g/dL (ref 32.0–36.0)
MCV: 90 fL (ref 80–100)
MONO ABS: 0.4 x10 3/mm (ref 0.2–1.0)
Monocyte %: 11.6 %
NEUTROS ABS: 1.5 x10 3/mm (ref 1.4–6.5)
NEUTROS PCT: 40.6 %
Platelet: 203 x10 3/mm (ref 150–440)
RBC: 4.6 10*6/uL (ref 4.40–5.90)
RDW: 14.7 % — AB (ref 11.5–14.5)
WBC: 3.7 x10 3/mm — ABNORMAL LOW (ref 3.8–10.6)

## 2014-09-18 LAB — LACTATE DEHYDROGENASE: LDH: 162 U/L

## 2015-01-22 ENCOUNTER — Ambulatory Visit: Payer: Medicaid Other | Admitting: Oncology

## 2015-01-22 ENCOUNTER — Other Ambulatory Visit: Payer: Medicaid Other

## 2015-01-29 ENCOUNTER — Other Ambulatory Visit: Payer: Self-pay | Admitting: *Deleted

## 2015-01-29 ENCOUNTER — Inpatient Hospital Stay: Payer: Medicaid Other

## 2015-01-29 ENCOUNTER — Inpatient Hospital Stay: Payer: Medicaid Other | Admitting: Oncology

## 2015-01-29 DIAGNOSIS — D72819 Decreased white blood cell count, unspecified: Secondary | ICD-10-CM

## 2015-02-06 ENCOUNTER — Inpatient Hospital Stay: Payer: Medicare Other

## 2015-02-06 ENCOUNTER — Inpatient Hospital Stay: Payer: Medicare Other | Attending: Oncology | Admitting: Oncology

## 2015-02-06 VITALS — BP 135/83 | HR 69 | Temp 96.9°F | Resp 16

## 2015-02-06 DIAGNOSIS — D72819 Decreased white blood cell count, unspecified: Secondary | ICD-10-CM | POA: Diagnosis present

## 2015-02-06 DIAGNOSIS — Z8673 Personal history of transient ischemic attack (TIA), and cerebral infarction without residual deficits: Secondary | ICD-10-CM | POA: Insufficient documentation

## 2015-02-06 DIAGNOSIS — I10 Essential (primary) hypertension: Secondary | ICD-10-CM | POA: Insufficient documentation

## 2015-02-06 DIAGNOSIS — Z79899 Other long term (current) drug therapy: Secondary | ICD-10-CM | POA: Diagnosis not present

## 2015-02-06 LAB — CBC WITH DIFFERENTIAL/PLATELET
Basophils Absolute: 0 10*3/uL (ref 0–0.1)
Basophils Relative: 1 %
EOS ABS: 0.1 10*3/uL (ref 0–0.7)
EOS PCT: 2 %
HCT: 40.2 % (ref 40.0–52.0)
Hemoglobin: 13.5 g/dL (ref 13.0–18.0)
LYMPHS ABS: 1.6 10*3/uL (ref 1.0–3.6)
Lymphocytes Relative: 45 %
MCH: 29.4 pg (ref 26.0–34.0)
MCHC: 33.4 g/dL (ref 32.0–36.0)
MCV: 88 fL (ref 80.0–100.0)
MONO ABS: 0.5 10*3/uL (ref 0.2–1.0)
MONOS PCT: 13 %
Neutro Abs: 1.4 10*3/uL (ref 1.4–6.5)
Neutrophils Relative %: 39 %
PLATELETS: 187 10*3/uL (ref 150–440)
RBC: 4.57 MIL/uL (ref 4.40–5.90)
RDW: 14.2 % (ref 11.5–14.5)
WBC: 3.5 10*3/uL — ABNORMAL LOW (ref 3.8–10.6)

## 2015-02-06 NOTE — Progress Notes (Signed)
[facility]  [phone]  [phone]     Brandon [name] DOB: [birth year]  MR#: [id number]  MWN#:[id number]  Patient Care Team: Sherron Monday, MD as PCP - General (Internal Medicine)  CHIEF COMPLAINT:  Chief Complaint  Patient presents with  . Leukopenia    INTERVAL HISTORY:  Patient is here for continued follow-up and further evaluation of leukopenia, initially seen Dr. Orlie Dakin in April 2016. He was found to have a decreased white blood cell count on routine blood work. Repeat laboratory work confirmed the results. Currently, he feels well and is asymptomatic. He denies any recent fevers or illnesses. He has no neurologic complaints. patient isn't a motorized wheelchair due to previous CVA and vertebral injury.  REVIEW OF SYSTEMS:   Review of Systems  Constitutional: Negative for fever, chills, weight loss, malaise/fatigue and diaphoresis.  HENT: Negative for congestion, ear discharge, ear pain, hearing loss, nosebleeds, sore throat and tinnitus.   Eyes: Negative for blurred vision, double vision, photophobia, pain, discharge and redness.  Respiratory: Negative for cough, hemoptysis, sputum production, shortness of breath, wheezing and stridor.   Cardiovascular: Negative for chest pain, palpitations, orthopnea, claudication, leg swelling and PND.  Gastrointestinal: Negative for heartburn, nausea, vomiting, abdominal pain, diarrhea, constipation, blood in stool and melena.  Genitourinary: Negative.   Musculoskeletal: Negative.   Skin: Negative.   Neurological: Negative for dizziness, tingling, focal weakness, seizures, weakness and headaches.  Endo/Heme/Allergies: Does not bruise/bleed easily.  Psychiatric/Behavioral: Negative for depression. The patient is not nervous/anxious and does not have insomnia.     As per HPI. Otherwise, a complete review of systems is negatve.   PAST MEDICAL HISTORY: Past Medical History  Diagnosis Date  .  Seasonal allergies   . Urinary hesitancy   . 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 Decreased range of motion of intervertebral discs of cervical spine     due to fusion C5-6  . Wears dentures     upper  . History of epidural hemorrhage 10/2005    trauma from [facility]  . Hyperlipidemia   . Stroke syndrome   . Hypertension     PAST SURGICAL HISTORY: Past Surgical History  Procedure Laterality Date  . Anterior cervical decomp/discectomy fusion  11/16/2005    C5-6  . Insertion of vena cava filter  11/09/2005  . Wrist fusion with iliac crest bone graft Left 09/27/2012    Procedure: LEFT WRIST SCAPHOID EXCISION WITH PARTIAL FUSION;  Surgeon: Jodi Marble, MD;  Location: [facility];  Service: Orthopedics;  Laterality: Left;    FAMILY HISTORY Family History  Problem Relation Age of Onset  . Heart disease Father   . Heart attack Father     GYNECOLOGIC HISTORY:  No LMP for male patient.     ADVANCED DIRECTIVES:    HEALTH MAINTENANCE: Social History  Substance Use Topics  . Smoking status: Former Smoker -- 20 years  . Smokeless tobacco: Never Used     Comment: quit smoking 2004  . Alcohol Use: Yes     Comment: 1/2 glass wine daily     Colonoscopy:  PAP:  Bone density:  Lipid panel:  No Known Allergies  Current Outpatient Prescriptions  Medication Sig Dispense Refill  . aspirin 81 MG tablet Take 81 mg by mouth daily.    Marland Kitchen  atorvastatin (LIPITOR) 10 MG tablet Take 10 mg by mouth daily.    . celecoxib (CELEBREX) 200 MG capsule Take 200 mg by mouth daily.     Marland Kitchen doxazosin (CARDURA) 1 MG tablet Take 1 mg by mouth 2 (two) times daily.    Marland Kitchen gabapentin (NEURONTIN) 300 MG capsule Take 300 mg by mouth 3 (three) times daily.    Marland Kitchen oxaprozin (DAYPRO) 600 MG tablet Take 600 mg by mouth 2 (two) times daily.    Marland Kitchen  oxybutynin (DITROPAN-XL) 5 MG 24 hr tablet TK 1 T PO QD  0  . traMADol-acetaminophen (ULTRACET) 37.5-325 MG per tablet Take 1 tablet by mouth every 6 (six) hours as needed for pain.    . vardenafil (LEVITRA) 20 MG tablet Take 20 mg by mouth daily as needed for erectile dysfunction.    Marland Kitchen zolpidem (AMBIEN) 5 MG tablet Take 5 mg by mouth at bedtime as needed for sleep. Every other night     No current facility-administered medications for this visit.    OBJECTIVE: BP 135/83 mmHg  Pulse 69  Temp(Src) 96.9 F (36.1 C) (Tympanic)  Resp 16  Wt    There is no weight on file to calculate BMI.    ECOG FS:0 - Asymptomatic  General: Well-developed, well-nourished, no acute distress. Patient uses motorized wheelchair due to right-sided weakness.  Eyes: Pink conjunctiva, anicteric sclera. HEENT: Normocephalic, moist mucous membranes, clear oropharnyx. Lungs: Clear to auscultation bilaterally. Heart: Regular rate and rhythm. No rubs, murmurs, or gallops. Abdomen: Soft, nontender, nondistended. No organomegaly noted, normoactive bowel sounds. Musculoskeletal: No edema, cyanosis, or clubbing. Limited mobility of her right arm and right leg Neuro: Alert, answering all questions appropriately. Cranial nerves grossly intact. Skin: No rashes or petechiae noted. Psych: Normal affect.    LAB RESULTS:  Appointment on 02/06/2015  Component Date Value Ref Range Status  . WBC 02/06/2015 3.5* 3.8 - 10.6 K/uL Final  . RBC 02/06/2015 4.57  4.40 - 5.90 MIL/uL Final  . Hemoglobin 02/06/2015 13.5  13.0 - 18.0 g/dL Final  . HCT 96/08/5407 40.2  40.0 - 52.0 % Final  . MCV 02/06/2015 88.0  80.0 - 100.0 fL Final  . MCH 02/06/2015 29.4  26.0 - 34.0 pg Final  . MCHC 02/06/2015 33.4  32.0 - 36.0 g/dL Final  . RDW 81/19/1478 14.2  11.5 - 14.5 % Final  . Platelets 02/06/2015 187  150 - 440 K/uL Final  . Neutrophils Relative % 02/06/2015 39   Final  . Neutro Abs 02/06/2015 1.4  1.4 - 6.5 K/uL Final  . Lymphocytes  Relative 02/06/2015 45   Final  . Lymphs Abs 02/06/2015 1.6  1.0 - 3.6 K/uL Final  . Monocytes Relative 02/06/2015 13   Final  . Monocytes Absolute 02/06/2015 0.5  0.2 - 1.0 K/uL Final  . Eosinophils Relative 02/06/2015 2   Final  . Eosinophils Absolute 02/06/2015 0.1  0 - 0.7 K/uL Final  . Basophils Relative 02/06/2015 1   Final  . Basophils Absolute 02/06/2015 0.0  0 - 0.1 K/uL Final    STUDIES: No results found.  ASSESSMENT:  Leukopenia   PLAN:  1. Leukopenia. Possibly medication induced, although gabapentin only reports a 1% incidence. White blood cell count is only mildly decreased today. Peripheral blood flow cytometry as well as antineutrophil antibodies are negative. No intervention is needed at this time.   Patient will return to clinic in 4 months with repeat laboratory work and further evaluation. If his white blood cell  count remains mildly decreased or is within normal limits and patient is asymptomatic, he can likely be discharged from clinic.    Patient expressed understanding and was in agreement with this plan. He also understands that He can call clinic at any time with any questions, concerns, or complaints.   Dr.  Orlie Dakin was available for consultation and review of plan of care for this patient.   Loann Quill, NP   02/06/2015 10:03 AM

## 2015-08-06 ENCOUNTER — Inpatient Hospital Stay: Payer: Medicare Other

## 2015-08-06 ENCOUNTER — Inpatient Hospital Stay: Payer: Medicare Other | Admitting: Oncology

## 2015-08-21 ENCOUNTER — Inpatient Hospital Stay: Payer: Medicare Other

## 2015-08-21 ENCOUNTER — Inpatient Hospital Stay: Payer: Medicare Other | Admitting: Oncology

## 2015-08-29 ENCOUNTER — Other Ambulatory Visit: Payer: Self-pay | Admitting: *Deleted

## 2015-08-29 DIAGNOSIS — D72819 Decreased white blood cell count, unspecified: Secondary | ICD-10-CM

## 2015-09-01 ENCOUNTER — Inpatient Hospital Stay: Payer: Medicare Other | Admitting: Oncology

## 2015-09-01 ENCOUNTER — Inpatient Hospital Stay: Payer: Medicare Other | Attending: Oncology

## 2015-11-26 ENCOUNTER — Ambulatory Visit: Payer: Medicare Other | Admitting: Physical Therapy

## 2015-12-03 ENCOUNTER — Ambulatory Visit: Payer: Medicare Other | Admitting: Physical Therapy

## 2015-12-04 ENCOUNTER — Ambulatory Visit: Payer: Medicare Other | Attending: Internal Medicine

## 2015-12-04 DIAGNOSIS — R262 Difficulty in walking, not elsewhere classified: Secondary | ICD-10-CM | POA: Diagnosis present

## 2015-12-04 DIAGNOSIS — M6281 Muscle weakness (generalized): Secondary | ICD-10-CM | POA: Diagnosis present
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
## 2015-12-09 ENCOUNTER — Encounter: Payer: Self-pay | Admitting: Physical Therapy

## 2015-12-09 ENCOUNTER — Ambulatory Visit: Payer: Medicare Other | Admitting: Physical Therapy

## 2015-12-09 DIAGNOSIS — R262 Difficulty in walking, not elsewhere classified: Secondary | ICD-10-CM

## 2015-12-09 DIAGNOSIS — M6281 Muscle weakness (generalized): Secondary | ICD-10-CM

## 2015-12-09 NOTE — Patient Instructions (Signed)
ABDUCTION: Sitting - Resistance Band (Active)    Sit with feet flat. Lift right leg slightly and, against yellow resistance band, draw it out to side. Complete _2__ sets of _10__ repetitions. Perform _1__ sessions per day.  Copyright  VHI. All rights reserved.  ABDUCTION: Sitting - Resistance Band (Active)   Copyright  VHI. All rights reserved.  Knee Extension: Short Arc (Eccentric) - Supine or Sitting    Lie on back with roll under knee. Extend knee. Slowly lower foot for 3-5 seconds. 10 reps for 2 sets, twice a day   http://ecce.exer.us/142   Copyright  VHI. All rights reserved.  Bridge Al[employer]a IncPose    Press small of back into mat, maintain pelvic tilt, roll up one vertebrae at a time. Focus on engaging posterior hip muscles. Perform 10 reps for 2 sets twice a day   Copyright  VHI. All rights reserved.  ADDUCTION: Sitting - Exercise Ball (Active)   Sit on bed or wheelchair,  Place towel roll between knees.  Squeeze towel roll for 3 seconds and then relax.  Perform 10 times, for 2 sets, twice a day.  .  Copyright  VHI. All rights reserved.
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
## 2015-12-11 ENCOUNTER — Ambulatory Visit: Payer: Medicare Other | Admitting: Physical Therapy

## 2015-12-11 ENCOUNTER — Encounter: Payer: Self-pay | Admitting: Physical Therapy

## 2015-12-11 DIAGNOSIS — R262 Difficulty in walking, not elsewhere classified: Secondary | ICD-10-CM

## 2015-12-11 DIAGNOSIS — M6281 Muscle weakness (generalized): Secondary | ICD-10-CM
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
## 2015-12-16 ENCOUNTER — Encounter: Payer: Self-pay | Admitting: Physical Therapy

## 2015-12-16 ENCOUNTER — Ambulatory Visit: Payer: Medicare Other | Admitting: Physical Therapy

## 2015-12-16 DIAGNOSIS — R262 Difficulty in walking, not elsewhere classified: Secondary | ICD-10-CM | POA: Diagnosis not present

## 2015-12-16 DIAGNOSIS — M6281 Muscle weakness (generalized): Secondary | ICD-10-CM
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
## 2015-12-18 ENCOUNTER — Ambulatory Visit: Payer: Medicare Other | Admitting: Physical Therapy

## 2015-12-18 ENCOUNTER — Encounter: Payer: Self-pay | Admitting: Physical Therapy

## 2015-12-18 DIAGNOSIS — M6281 Muscle weakness (generalized): Secondary | ICD-10-CM

## 2015-12-18 DIAGNOSIS — R262 Difficulty in walking, not elsewhere classified: Secondary | ICD-10-CM
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
## 2015-12-23 ENCOUNTER — Ambulatory Visit: Payer: Medicare Other | Admitting: Physical Therapy

## 2015-12-23 ENCOUNTER — Encounter: Payer: Self-pay | Admitting: Physical Therapy

## 2015-12-23 DIAGNOSIS — M6281 Muscle weakness (generalized): Secondary | ICD-10-CM

## 2015-12-23 DIAGNOSIS — R262 Difficulty in walking, not elsewhere classified: Secondary | ICD-10-CM
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
## 2015-12-25 ENCOUNTER — Ambulatory Visit: Payer: Medicare Other | Admitting: Physical Therapy

## 2015-12-25 ENCOUNTER — Encounter: Payer: Self-pay | Admitting: Physical Therapy

## 2015-12-25 DIAGNOSIS — M6281 Muscle weakness (generalized): Secondary | ICD-10-CM

## 2015-12-25 DIAGNOSIS — R262 Difficulty in walking, not elsewhere classified: Secondary | ICD-10-CM | POA: Diagnosis not present
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
## 2015-12-25 NOTE — Patient Instructions (Addendum)
HIP / KNEE: Flexion, Knee to Chest - Supine    Raise knee to chest. Keep leg in a straight line. Perform slowly. __10_ reps per set, 2___ sets per day, _5__ days per week   Copyright  VHI. All rights reserved.  Heel Slides    Slide heel towards bottom.  May do in sitting with a towel under heel sitting in wheelchair.  10 reps x 2 sets each day      Copyright  VHI. All rights reserved.

## 2015-12-30 ENCOUNTER — Ambulatory Visit: Payer: Medicare Other | Attending: Internal Medicine | Admitting: Physical Therapy

## 2015-12-30 ENCOUNTER — Encounter: Payer: Self-pay | Admitting: Physical Therapy

## 2015-12-30 DIAGNOSIS — M6281 Muscle weakness (generalized): Secondary | ICD-10-CM | POA: Insufficient documentation

## 2015-12-30 DIAGNOSIS — R262 Difficulty in walking, not elsewhere classified: Secondary | ICD-10-CM | POA: Diagnosis present
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
## 2016-01-01 ENCOUNTER — Encounter: Payer: Self-pay | Admitting: Physical Therapy

## 2016-01-01 ENCOUNTER — Ambulatory Visit: Payer: Medicare Other | Admitting: Physical Therapy

## 2016-01-01 DIAGNOSIS — M6281 Muscle weakness (generalized): Secondary | ICD-10-CM

## 2016-01-01 DIAGNOSIS — R262 Difficulty in walking, not elsewhere classified: Secondary | ICD-10-CM | POA: Diagnosis not present
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
## 2016-01-06 ENCOUNTER — Encounter: Payer: Self-pay | Admitting: Physical Therapy

## 2016-01-06 ENCOUNTER — Ambulatory Visit: Payer: Medicare Other | Admitting: Physical Therapy

## 2016-01-06 DIAGNOSIS — R262 Difficulty in walking, not elsewhere classified: Secondary | ICD-10-CM

## 2016-01-06 DIAGNOSIS — M6281 Muscle weakness (generalized): Secondary | ICD-10-CM
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
## 2016-01-08 ENCOUNTER — Encounter: Payer: Self-pay | Admitting: Physical Therapy

## 2016-01-08 ENCOUNTER — Ambulatory Visit: Payer: Medicare Other | Admitting: Physical Therapy

## 2016-01-08 DIAGNOSIS — R262 Difficulty in walking, not elsewhere classified: Secondary | ICD-10-CM | POA: Diagnosis not present

## 2016-01-08 DIAGNOSIS — M6281 Muscle weakness (generalized): Secondary | ICD-10-CM
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
## 2016-01-12 ENCOUNTER — Encounter: Payer: Self-pay | Admitting: Physical Therapy

## 2016-01-12 ENCOUNTER — Ambulatory Visit: Payer: Medicare Other | Admitting: Physical Therapy

## 2016-01-12 DIAGNOSIS — R262 Difficulty in walking, not elsewhere classified: Secondary | ICD-10-CM

## 2016-01-12 DIAGNOSIS — M6281 Muscle weakness (generalized): Secondary | ICD-10-CM
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
## 2016-01-14 ENCOUNTER — Ambulatory Visit: Payer: Medicare Other | Admitting: Physical Therapy

## 2016-01-19 ENCOUNTER — Encounter: Payer: Self-pay | Admitting: Physical Therapy

## 2016-01-19 ENCOUNTER — Ambulatory Visit: Payer: Medicare Other | Admitting: Physical Therapy

## 2016-01-19 DIAGNOSIS — R262 Difficulty in walking, not elsewhere classified: Secondary | ICD-10-CM

## 2016-01-19 DIAGNOSIS — M6281 Muscle weakness (generalized): Secondary | ICD-10-CM
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
## 2016-01-26 ENCOUNTER — Ambulatory Visit: Payer: Medicare Other | Admitting: Physical Therapy

## 2016-01-26 ENCOUNTER — Encounter: Payer: Self-pay | Admitting: Physical Therapy

## 2016-01-26 DIAGNOSIS — R262 Difficulty in walking, not elsewhere classified: Secondary | ICD-10-CM

## 2016-01-26 DIAGNOSIS — M6281 Muscle weakness (generalized): Secondary | ICD-10-CM
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
## 2016-01-28 ENCOUNTER — Ambulatory Visit: Payer: Medicare Other | Admitting: Physical Therapy

## 2016-01-28 ENCOUNTER — Encounter: Payer: Self-pay | Admitting: Physical Therapy

## 2016-01-28 DIAGNOSIS — R262 Difficulty in walking, not elsewhere classified: Secondary | ICD-10-CM

## 2016-01-28 DIAGNOSIS — M6281 Muscle weakness (generalized): Secondary | ICD-10-CM
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
## 2016-02-03 ENCOUNTER — Ambulatory Visit: Payer: Medicare Other | Attending: Internal Medicine | Admitting: Physical Therapy

## 2016-02-03 ENCOUNTER — Encounter: Payer: Self-pay | Admitting: Physical Therapy

## 2016-02-03 DIAGNOSIS — M6281 Muscle weakness (generalized): Secondary | ICD-10-CM | POA: Diagnosis present

## 2016-02-03 DIAGNOSIS — R262 Difficulty in walking, not elsewhere classified: Secondary | ICD-10-CM | POA: Diagnosis present
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
## 2016-02-05 ENCOUNTER — Encounter: Payer: Self-pay | Admitting: Physical Therapy

## 2016-02-05 ENCOUNTER — Ambulatory Visit: Payer: Medicare Other | Admitting: Physical Therapy

## 2016-02-05 DIAGNOSIS — R262 Difficulty in walking, not elsewhere classified: Secondary | ICD-10-CM

## 2016-02-05 DIAGNOSIS — M6281 Muscle weakness (generalized): Secondary | ICD-10-CM
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
## 2016-02-09 ENCOUNTER — Ambulatory Visit: Payer: Medicare Other | Admitting: Physical Therapy

## 2016-02-11 ENCOUNTER — Ambulatory Visit: Payer: Medicare Other | Admitting: Physical Therapy

## 2016-02-16 ENCOUNTER — Encounter: Payer: Self-pay | Admitting: Physical Therapy

## 2016-02-16 ENCOUNTER — Ambulatory Visit: Payer: Medicare Other | Admitting: Physical Therapy

## 2016-02-16 DIAGNOSIS — R262 Difficulty in walking, not elsewhere classified: Secondary | ICD-10-CM

## 2016-02-16 DIAGNOSIS — M6281 Muscle weakness (generalized): Secondary | ICD-10-CM | POA: Diagnosis not present
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
## 2016-02-18 ENCOUNTER — Ambulatory Visit: Payer: Medicare Other | Admitting: Physical Therapy

## 2016-02-18 ENCOUNTER — Encounter: Payer: Self-pay | Admitting: Physical Therapy

## 2016-02-18 DIAGNOSIS — M6281 Muscle weakness (generalized): Secondary | ICD-10-CM

## 2016-02-18 DIAGNOSIS — R262 Difficulty in walking, not elsewhere classified: Secondary | ICD-10-CM
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
## 2016-02-23 ENCOUNTER — Ambulatory Visit: Payer: Medicare Other | Admitting: Physical Therapy

## 2016-02-25 ENCOUNTER — Encounter: Payer: Self-pay | Admitting: Physical Therapy

## 2016-02-25 ENCOUNTER — Ambulatory Visit: Payer: Medicare Other | Admitting: Physical Therapy

## 2016-02-25 DIAGNOSIS — M6281 Muscle weakness (generalized): Secondary | ICD-10-CM | POA: Diagnosis not present

## 2016-02-25 DIAGNOSIS — R262 Difficulty in walking, not elsewhere classified: Secondary | ICD-10-CM
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
## 2016-03-01 ENCOUNTER — Ambulatory Visit: Payer: Medicare Other | Attending: Internal Medicine | Admitting: Physical Therapy

## 2016-03-01 ENCOUNTER — Encounter: Payer: Self-pay | Admitting: Physical Therapy

## 2016-03-01 DIAGNOSIS — M6281 Muscle weakness (generalized): Secondary | ICD-10-CM | POA: Diagnosis present

## 2016-03-01 DIAGNOSIS — R262 Difficulty in walking, not elsewhere classified: Secondary | ICD-10-CM | POA: Insufficient documentation
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
## 2016-03-03 ENCOUNTER — Encounter: Payer: Self-pay | Admitting: Physical Therapy

## 2016-03-03 ENCOUNTER — Ambulatory Visit: Payer: Medicare Other | Admitting: Physical Therapy

## 2016-03-03 DIAGNOSIS — R262 Difficulty in walking, not elsewhere classified: Secondary | ICD-10-CM | POA: Diagnosis not present

## 2016-03-03 DIAGNOSIS — M6281 Muscle weakness (generalized): Secondary | ICD-10-CM
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
## 2016-03-08 ENCOUNTER — Ambulatory Visit: Payer: Medicare Other | Admitting: Physical Therapy

## 2016-03-08 ENCOUNTER — Encounter: Payer: Self-pay | Admitting: Physical Therapy

## 2016-03-08 DIAGNOSIS — R262 Difficulty in walking, not elsewhere classified: Secondary | ICD-10-CM | POA: Diagnosis not present

## 2016-03-08 DIAGNOSIS — M6281 Muscle weakness (generalized): Secondary | ICD-10-CM
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
## 2016-03-10 ENCOUNTER — Encounter: Payer: Self-pay | Admitting: Physical Therapy

## 2016-03-10 ENCOUNTER — Ambulatory Visit: Payer: Medicare Other | Admitting: Physical Therapy

## 2016-03-10 DIAGNOSIS — M6281 Muscle weakness (generalized): Secondary | ICD-10-CM

## 2016-03-10 DIAGNOSIS — R262 Difficulty in walking, not elsewhere classified: Secondary | ICD-10-CM | POA: Diagnosis not present
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
## 2016-03-15 ENCOUNTER — Ambulatory Visit: Payer: Medicare Other | Admitting: Physical Therapy

## 2016-03-16 ENCOUNTER — Ambulatory Visit: Payer: Medicare Other | Admitting: Physical Therapy

## 2016-03-18 ENCOUNTER — Encounter: Payer: Self-pay | Admitting: Physical Therapy

## 2016-03-18 ENCOUNTER — Ambulatory Visit: Payer: Medicare Other | Admitting: Physical Therapy

## 2016-03-18 DIAGNOSIS — R262 Difficulty in walking, not elsewhere classified: Secondary | ICD-10-CM

## 2016-03-18 DIAGNOSIS — M6281 Muscle weakness (generalized): Secondary | ICD-10-CM
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
## 2016-03-22 ENCOUNTER — Ambulatory Visit: Payer: Medicare Other

## 2016-03-22 DIAGNOSIS — R262 Difficulty in walking, not elsewhere classified: Secondary | ICD-10-CM

## 2016-03-22 DIAGNOSIS — M6281 Muscle weakness (generalized): Secondary | ICD-10-CM
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
## 2016-03-24 ENCOUNTER — Ambulatory Visit: Payer: Medicare Other | Admitting: Physical Therapy

## 2016-03-24 ENCOUNTER — Encounter: Payer: Self-pay | Admitting: Physical Therapy

## 2016-03-24 VITALS — BP 128/80 | HR 68

## 2016-03-24 DIAGNOSIS — R262 Difficulty in walking, not elsewhere classified: Secondary | ICD-10-CM

## 2016-03-24 DIAGNOSIS — M6281 Muscle weakness (generalized): Secondary | ICD-10-CM
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
## 2016-10-20 ENCOUNTER — Ambulatory Visit: Payer: Medicare Other | Admitting: Physical Therapy

## 2016-10-27 ENCOUNTER — Ambulatory Visit: Payer: Medicare Other | Attending: Internal Medicine | Admitting: Physical Therapy

## 2016-10-27 ENCOUNTER — Ambulatory Visit: Payer: Medicare Other | Admitting: Physical Therapy

## 2016-10-27 ENCOUNTER — Encounter: Payer: Self-pay | Admitting: Physical Therapy

## 2016-10-27 DIAGNOSIS — M6281 Muscle weakness (generalized): Secondary | ICD-10-CM

## 2016-10-27 DIAGNOSIS — R2681 Unsteadiness on feet: Secondary | ICD-10-CM

## 2016-10-27 DIAGNOSIS — R262 Difficulty in walking, not elsewhere classified: Secondary | ICD-10-CM | POA: Diagnosis present
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
## 2016-11-01 ENCOUNTER — Ambulatory Visit: Payer: Medicare Other | Attending: Internal Medicine | Admitting: Physical Therapy

## 2016-11-01 DIAGNOSIS — M6281 Muscle weakness (generalized): Secondary | ICD-10-CM | POA: Insufficient documentation

## 2016-11-01 DIAGNOSIS — R2681 Unsteadiness on feet: Secondary | ICD-10-CM | POA: Insufficient documentation

## 2016-11-01 DIAGNOSIS — R262 Difficulty in walking, not elsewhere classified: Secondary | ICD-10-CM | POA: Insufficient documentation

## 2016-11-03 ENCOUNTER — Ambulatory Visit: Payer: Medicare Other | Admitting: Physical Therapy

## 2016-11-08 ENCOUNTER — Ambulatory Visit: Payer: Medicare Other | Admitting: Physical Therapy

## 2016-11-08 ENCOUNTER — Encounter: Payer: Self-pay | Admitting: Physical Therapy

## 2016-11-08 DIAGNOSIS — M6281 Muscle weakness (generalized): Secondary | ICD-10-CM | POA: Diagnosis not present

## 2016-11-08 DIAGNOSIS — R2681 Unsteadiness on feet: Secondary | ICD-10-CM

## 2016-11-08 DIAGNOSIS — R262 Difficulty in walking, not elsewhere classified: Secondary | ICD-10-CM
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
## 2016-11-10 ENCOUNTER — Encounter: Payer: Self-pay | Admitting: Physical Therapy

## 2016-11-10 ENCOUNTER — Ambulatory Visit: Payer: Medicare Other | Admitting: Physical Therapy

## 2016-11-10 DIAGNOSIS — M6281 Muscle weakness (generalized): Secondary | ICD-10-CM | POA: Diagnosis not present

## 2016-11-10 DIAGNOSIS — R2681 Unsteadiness on feet: Secondary | ICD-10-CM

## 2016-11-10 DIAGNOSIS — R262 Difficulty in walking, not elsewhere classified: Secondary | ICD-10-CM
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
## 2016-11-15 ENCOUNTER — Ambulatory Visit: Payer: Medicare Other | Admitting: Physical Therapy

## 2016-11-15 DIAGNOSIS — R2681 Unsteadiness on feet: Secondary | ICD-10-CM

## 2016-11-15 DIAGNOSIS — R262 Difficulty in walking, not elsewhere classified: Secondary | ICD-10-CM

## 2016-11-15 DIAGNOSIS — M6281 Muscle weakness (generalized): Secondary | ICD-10-CM | POA: Diagnosis not present
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
## 2016-11-17 ENCOUNTER — Encounter: Payer: Self-pay | Admitting: Physical Therapy

## 2016-11-17 ENCOUNTER — Ambulatory Visit: Payer: Medicare Other | Admitting: Physical Therapy

## 2016-11-17 DIAGNOSIS — R262 Difficulty in walking, not elsewhere classified: Secondary | ICD-10-CM

## 2016-11-17 DIAGNOSIS — M6281 Muscle weakness (generalized): Secondary | ICD-10-CM | POA: Diagnosis not present

## 2016-11-17 DIAGNOSIS — R2681 Unsteadiness on feet: Secondary | ICD-10-CM
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
## 2016-11-22 ENCOUNTER — Ambulatory Visit: Payer: Medicare Other | Admitting: Physical Therapy

## 2016-11-22 ENCOUNTER — Encounter: Payer: Self-pay | Admitting: Physical Therapy

## 2016-11-22 DIAGNOSIS — R2681 Unsteadiness on feet: Secondary | ICD-10-CM

## 2016-11-22 DIAGNOSIS — M6281 Muscle weakness (generalized): Secondary | ICD-10-CM | POA: Diagnosis not present

## 2016-11-22 DIAGNOSIS — R262 Difficulty in walking, not elsewhere classified: Secondary | ICD-10-CM
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
## 2016-11-24 ENCOUNTER — Ambulatory Visit: Payer: Medicare Other | Admitting: Physical Therapy

## 2016-11-29 ENCOUNTER — Encounter: Payer: Self-pay | Admitting: Physical Therapy

## 2016-11-29 ENCOUNTER — Ambulatory Visit: Payer: Medicare Other | Attending: Internal Medicine | Admitting: Physical Therapy

## 2016-11-29 VITALS — BP 115/76 | HR 63

## 2016-11-29 DIAGNOSIS — R262 Difficulty in walking, not elsewhere classified: Secondary | ICD-10-CM

## 2016-11-29 DIAGNOSIS — R2681 Unsteadiness on feet: Secondary | ICD-10-CM | POA: Diagnosis present

## 2016-11-29 DIAGNOSIS — M6281 Muscle weakness (generalized): Secondary | ICD-10-CM | POA: Insufficient documentation
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
## 2016-12-02 ENCOUNTER — Ambulatory Visit: Payer: Medicare Other | Admitting: Physical Therapy

## 2016-12-06 ENCOUNTER — Ambulatory Visit: Payer: Medicare Other | Admitting: Physical Therapy

## 2016-12-06 ENCOUNTER — Encounter: Payer: Self-pay | Admitting: Physical Therapy

## 2016-12-06 DIAGNOSIS — R2681 Unsteadiness on feet: Secondary | ICD-10-CM

## 2016-12-06 DIAGNOSIS — R262 Difficulty in walking, not elsewhere classified: Secondary | ICD-10-CM

## 2016-12-06 DIAGNOSIS — M6281 Muscle weakness (generalized): Secondary | ICD-10-CM | POA: Diagnosis not present
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
## 2016-12-08 ENCOUNTER — Encounter: Payer: Self-pay | Admitting: Physical Therapy

## 2016-12-08 ENCOUNTER — Ambulatory Visit: Payer: Medicare Other | Admitting: Physical Therapy

## 2016-12-08 DIAGNOSIS — R262 Difficulty in walking, not elsewhere classified: Secondary | ICD-10-CM

## 2016-12-08 DIAGNOSIS — M6281 Muscle weakness (generalized): Secondary | ICD-10-CM | POA: Diagnosis not present

## 2016-12-08 DIAGNOSIS — R2681 Unsteadiness on feet: Secondary | ICD-10-CM
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
## 2016-12-13 ENCOUNTER — Encounter: Payer: Self-pay | Admitting: Physical Therapy

## 2016-12-13 ENCOUNTER — Ambulatory Visit: Payer: Medicare Other | Admitting: Physical Therapy

## 2016-12-13 DIAGNOSIS — R262 Difficulty in walking, not elsewhere classified: Secondary | ICD-10-CM

## 2016-12-13 DIAGNOSIS — M6281 Muscle weakness (generalized): Secondary | ICD-10-CM | POA: Diagnosis not present

## 2016-12-13 DIAGNOSIS — R2681 Unsteadiness on feet: Secondary | ICD-10-CM
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
## 2016-12-15 ENCOUNTER — Encounter: Payer: Self-pay | Admitting: Physical Therapy

## 2016-12-15 ENCOUNTER — Ambulatory Visit: Payer: Medicare Other | Admitting: Physical Therapy

## 2016-12-15 DIAGNOSIS — R262 Difficulty in walking, not elsewhere classified: Secondary | ICD-10-CM

## 2016-12-15 DIAGNOSIS — R2681 Unsteadiness on feet: Secondary | ICD-10-CM

## 2016-12-15 DIAGNOSIS — M6281 Muscle weakness (generalized): Secondary | ICD-10-CM | POA: Diagnosis not present
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
## 2016-12-15 NOTE — Patient Instructions (Signed)
WALKING  Walking is a great form of exercise to increase your strength, endurance and overall fitness.  A walking program can help you start slowly and gradually build endurance as you go.  Everyone's ability is different, so each person's starting point will be different.  You do not have to follow them exactly.  The are just samples. You should simply find out what's right for you and stick to that program.   In the beginning, you'll start off walking 2-3 times a day for short distances.  As you get stronger, you'll be walking further at just 1-2 times per day.  A. You Can Walk For A Certain Length Of Time Each Day    Walk 5 minutes 3 times per day.  Increase 2 minutes every 2 days (3 times per day).  Work up to 25-30 minutes (1-2 times per day).   Example:   Day 1-2 5 minutes 3 times per day   Day 7-8 12 minutes 2-3 times per day   Day 13-14 25 minutes 1-2 times per day  B. You Can Walk For a Certain Distance Each Day     Distance can be substituted for time.    Example:   3 trips to mailbox (at road)   3 trips to corner of block   3 trips around the block  C. Go to local [employer] and use the track.    Walk for distance ____ around track  Or time ____ minutes  D. Walk ____ Jog ____ Run ___  Please only do the exercises that your therapist has initialed and dated   Copyright  VHI. All rights reserved.  Bracing With Leg March (Hook-Lying)    With neutral spine, tighten pelvic floor and abdominals and hold. Alternating legs, lift foot _10__ inches and return to floor. Repeat _15__ times. Do _2__ times a day.   Copyright  VHI. All rights reserved.  ABDUCTION: Sitting - Exercise Ball: Resistance Band (Active)    Sit with feet flat. Lift right leg slightly and, against yellow resistance band, draw it out to side. Complete _2__ sets of _15__ repetitions. Perform __2_ sessions per day.   Knee Extension: Sitting (Single Leg)    Sitting, face away from anchor,  knee flexed. Extend knee. Repeat _10_ times per set. Repeat with other leg. Do _2_ sets per session. Do _2_ sessions per week.  http://tub.exer.us/55      Copyright  VHI. All rights reserved.  Sit to Stand / Stand to Sit / Transfers    Sit on edge of a solid chair with arms, feet flat on floor. Lean forward over feet and stand up with hands on chair arms. Sit down slowly with hands on chair arms. Repeat __5__ times per session. Do __2__ sessions per day.  Copyright  VHI. All rights reserved.   KNEE: Extension (Isometric)    Lie on back, legs straight. Tighten quadriceps by pushing back of knee into surface. Hold _5__ seconds. 10___ reps per set, __2_ sets per day, __3_ days per week   Copyright  VHI. All rights reserved.

## 2016-12-20 ENCOUNTER — Ambulatory Visit: Payer: Medicare Other | Admitting: Physical Therapy

## 2016-12-22 ENCOUNTER — Encounter: Payer: Self-pay | Admitting: Physical Therapy

## 2016-12-22 ENCOUNTER — Ambulatory Visit: Payer: Medicare Other | Admitting: Physical Therapy

## 2016-12-22 DIAGNOSIS — M6281 Muscle weakness (generalized): Secondary | ICD-10-CM | POA: Diagnosis not present

## 2016-12-22 DIAGNOSIS — R2681 Unsteadiness on feet: Secondary | ICD-10-CM

## 2016-12-22 DIAGNOSIS — R262 Difficulty in walking, not elsewhere classified: Secondary | ICD-10-CM
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
## 2016-12-27 ENCOUNTER — Ambulatory Visit: Payer: Medicare Other | Admitting: Physical Therapy

## 2016-12-27 ENCOUNTER — Encounter: Payer: Self-pay | Admitting: Physical Therapy

## 2016-12-27 DIAGNOSIS — R262 Difficulty in walking, not elsewhere classified: Secondary | ICD-10-CM

## 2016-12-27 DIAGNOSIS — M6281 Muscle weakness (generalized): Secondary | ICD-10-CM | POA: Diagnosis not present

## 2016-12-27 DIAGNOSIS — R2681 Unsteadiness on feet: Secondary | ICD-10-CM
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
## 2016-12-29 ENCOUNTER — Ambulatory Visit: Payer: Medicare Other | Attending: Internal Medicine | Admitting: Physical Therapy

## 2016-12-29 ENCOUNTER — Encounter: Payer: Self-pay | Admitting: Physical Therapy

## 2016-12-29 DIAGNOSIS — M6281 Muscle weakness (generalized): Secondary | ICD-10-CM | POA: Diagnosis present

## 2016-12-29 DIAGNOSIS — R262 Difficulty in walking, not elsewhere classified: Secondary | ICD-10-CM

## 2016-12-29 DIAGNOSIS — R2681 Unsteadiness on feet: Secondary | ICD-10-CM
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
## 2017-01-03 ENCOUNTER — Ambulatory Visit: Payer: Medicare Other | Admitting: Physical Therapy

## 2017-01-03 ENCOUNTER — Encounter: Payer: Self-pay | Admitting: Physical Therapy

## 2017-01-03 DIAGNOSIS — R2681 Unsteadiness on feet: Secondary | ICD-10-CM

## 2017-01-03 DIAGNOSIS — R262 Difficulty in walking, not elsewhere classified: Secondary | ICD-10-CM

## 2017-01-03 DIAGNOSIS — M6281 Muscle weakness (generalized): Secondary | ICD-10-CM
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
## 2017-01-03 NOTE — Patient Instructions (Addendum)
ABDUCTION: Sitting - Exercise Ball (Active)    Sit with feet flat. Lift right leg slightly and draw it out to side. Use ___ lbs. Complete ___ sets of ___ repetitions. Perform ___ sessions per day.  Copyright  VHI. All rights reserved.   Butterfly, Supine    Lie on back, feet together. Lower knees toward floor. Hold _2__ seconds. Repeat __10_ times per session. Do __2_ sessions per day.  Copyright  VHI. All rights reserved.

## 2017-01-05 ENCOUNTER — Encounter: Payer: Self-pay | Admitting: Physical Therapy

## 2017-01-05 ENCOUNTER — Ambulatory Visit: Payer: Medicare Other | Admitting: Physical Therapy

## 2017-01-05 DIAGNOSIS — R2681 Unsteadiness on feet: Secondary | ICD-10-CM

## 2017-01-05 DIAGNOSIS — M6281 Muscle weakness (generalized): Secondary | ICD-10-CM

## 2017-01-05 DIAGNOSIS — R262 Difficulty in walking, not elsewhere classified: Secondary | ICD-10-CM
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
## 2017-01-10 ENCOUNTER — Ambulatory Visit: Payer: Medicare Other | Admitting: Physical Therapy

## 2017-01-10 ENCOUNTER — Encounter: Payer: Self-pay | Admitting: Physical Therapy

## 2017-01-10 DIAGNOSIS — R2681 Unsteadiness on feet: Secondary | ICD-10-CM

## 2017-01-10 DIAGNOSIS — M6281 Muscle weakness (generalized): Secondary | ICD-10-CM

## 2017-01-10 DIAGNOSIS — R262 Difficulty in walking, not elsewhere classified: Secondary | ICD-10-CM
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
## 2017-01-12 ENCOUNTER — Encounter: Payer: Self-pay | Admitting: Physical Therapy

## 2017-01-12 ENCOUNTER — Ambulatory Visit: Payer: Medicare Other | Admitting: Physical Therapy

## 2017-01-12 DIAGNOSIS — M6281 Muscle weakness (generalized): Secondary | ICD-10-CM | POA: Diagnosis not present

## 2017-01-12 DIAGNOSIS — R2681 Unsteadiness on feet: Secondary | ICD-10-CM

## 2017-01-12 DIAGNOSIS — R262 Difficulty in walking, not elsewhere classified: Secondary | ICD-10-CM
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
## 2017-01-19 ENCOUNTER — Ambulatory Visit: Payer: Medicare Other | Admitting: Physical Therapy

## 2017-01-25 ENCOUNTER — Ambulatory Visit: Payer: Medicare Other | Admitting: Physical Therapy

## 2017-02-03 ENCOUNTER — Ambulatory Visit: Payer: Medicare Other | Attending: Internal Medicine | Admitting: Physical Therapy

## 2019-06-15 ENCOUNTER — Emergency Department: Payer: Medicare Other

## 2019-06-15 ENCOUNTER — Encounter: Payer: Self-pay | Admitting: Emergency Medicine

## 2019-06-15 ENCOUNTER — Other Ambulatory Visit: Payer: Self-pay

## 2019-06-15 ENCOUNTER — Observation Stay: Payer: Medicare Other

## 2019-06-15 ENCOUNTER — Inpatient Hospital Stay
Admission: EM | Admit: 2019-06-15 | Discharge: 2019-06-19 | DRG: 057 | Disposition: A | Discharge status: Skilled Nursing Facility | Payer: Medicare Other | Attending: Internal Medicine | Admitting: Internal Medicine

## 2019-06-15 DIAGNOSIS — F02818 Dementia in other diseases classified elsewhere, unspecified severity, with other behavioral disturbance: Secondary | ICD-10-CM | POA: Diagnosis present

## 2019-06-15 DIAGNOSIS — Z7982 Long term (current) use of aspirin: Secondary | ICD-10-CM

## 2019-06-15 DIAGNOSIS — N390 Urinary tract infection, site not specified: Secondary | ICD-10-CM

## 2019-06-15 DIAGNOSIS — Z79899 Other long term (current) drug therapy: Secondary | ICD-10-CM

## 2019-06-15 DIAGNOSIS — G309 Alzheimer's disease, unspecified: Principal | ICD-10-CM | POA: Diagnosis present

## 2019-06-15 DIAGNOSIS — Z20822 Contact with and (suspected) exposure to covid-19: Secondary | ICD-10-CM | POA: Diagnosis present

## 2019-06-15 DIAGNOSIS — I674 Hypertensive encephalopathy: Secondary | ICD-10-CM | POA: Diagnosis present

## 2019-06-15 DIAGNOSIS — Z8249 Family history of ischemic heart disease and other diseases of the circulatory system: Secondary | ICD-10-CM

## 2019-06-15 DIAGNOSIS — Z981 Arthrodesis status: Secondary | ICD-10-CM

## 2019-06-15 DIAGNOSIS — R410 Disorientation, unspecified: Secondary | ICD-10-CM | POA: Diagnosis not present

## 2019-06-15 DIAGNOSIS — Z8673 Personal history of transient ischemic attack (TIA), and cerebral infarction without residual deficits: Secondary | ICD-10-CM

## 2019-06-15 DIAGNOSIS — M1990 Unspecified osteoarthritis, unspecified site: Secondary | ICD-10-CM | POA: Diagnosis present

## 2019-06-15 DIAGNOSIS — G8929 Other chronic pain: Secondary | ICD-10-CM | POA: Diagnosis present

## 2019-06-15 DIAGNOSIS — F0281 Dementia in other diseases classified elsewhere with behavioral disturbance: Secondary | ICD-10-CM | POA: Diagnosis present

## 2019-06-15 DIAGNOSIS — I69354 Hemiplegia and hemiparesis following cerebral infarction affecting left non-dominant side: Secondary | ICD-10-CM

## 2019-06-15 DIAGNOSIS — I739 Peripheral vascular disease, unspecified: Secondary | ICD-10-CM | POA: Diagnosis present

## 2019-06-15 DIAGNOSIS — Z87891 Personal history of nicotine dependence: Secondary | ICD-10-CM

## 2019-06-15 DIAGNOSIS — G9341 Metabolic encephalopathy: Secondary | ICD-10-CM

## 2019-06-15 DIAGNOSIS — R079 Chest pain, unspecified: Secondary | ICD-10-CM | POA: Diagnosis present

## 2019-06-15 DIAGNOSIS — Z79891 Long term (current) use of opiate analgesic: Secondary | ICD-10-CM

## 2019-06-15 DIAGNOSIS — I10 Essential (primary) hypertension: Secondary | ICD-10-CM | POA: Diagnosis present

## 2019-06-15 DIAGNOSIS — E785 Hyperlipidemia, unspecified: Secondary | ICD-10-CM | POA: Diagnosis present

## 2019-06-15 LAB — URINALYSIS, COMPLETE (UACMP) WITH MICROSCOPIC
Bilirubin Urine: NEGATIVE
Glucose, UA: NEGATIVE mg/dL
Hgb urine dipstick: NEGATIVE
Ketones, ur: NEGATIVE mg/dL
Nitrite: NEGATIVE
Protein, ur: NEGATIVE mg/dL
Specific Gravity, Urine: 1.01 (ref 1.005–1.030)
pH: 7 (ref 5.0–8.0)

## 2019-06-15 LAB — TROPONIN I (HIGH SENSITIVITY)
Troponin I (High Sensitivity): <2 ng/L (ref <18)
Troponin I (High Sensitivity): <2 ng/L (ref <18)

## 2019-06-15 LAB — HEPATIC FUNCTION PANEL
ALT: 9 U/L (ref 0–44)
AST: 17 U/L (ref 15–41)
Albumin: 3.5 g/dL (ref 3.5–5.0)
Alkaline Phosphatase: 60 U/L (ref 38–126)
Bilirubin, Direct: 0.1 mg/dL (ref 0.0–0.2)
Indirect Bilirubin: 0.6 mg/dL (ref 0.3–0.9)
Total Bilirubin: 0.7 mg/dL (ref 0.3–1.2)
Total Protein: 6.4 g/dL — ABNORMAL LOW (ref 6.5–8.1)

## 2019-06-15 LAB — CBC
HCT: 40.9 % (ref 39.0–52.0)
Hemoglobin: 13.1 g/dL (ref 13.0–17.0)
MCH: 28.9 pg (ref 26.0–34.0)
MCHC: 32 g/dL (ref 30.0–36.0)
MCV: 90.3 fL (ref 80.0–100.0)
Platelets: 150 10*3/uL (ref 150–400)
RBC: 4.53 MIL/uL (ref 4.22–5.81)
RDW: 14.6 % (ref 11.5–15.5)
WBC: 3.8 10*3/uL — ABNORMAL LOW (ref 4.0–10.5)
nRBC: 0 % (ref 0.0–0.2)

## 2019-06-15 LAB — BASIC METABOLIC PANEL
Anion gap: 8 (ref 5–15)
BUN: 7 mg/dL — ABNORMAL LOW (ref 8–23)
CO2: 22 mmol/L (ref 22–32)
Calcium: 8.3 mg/dL — ABNORMAL LOW (ref 8.9–10.3)
Chloride: 105 mmol/L (ref 98–111)
Creatinine, Ser: 0.8 mg/dL (ref 0.61–1.24)
GFR calc Af Amer: >60 mL/min (ref >60)
GFR calc non Af Amer: >60 mL/min (ref >60)
Glucose, Bld: 106 mg/dL — ABNORMAL HIGH (ref 70–99)
Potassium: 4.2 mmol/L (ref 3.5–5.1)
Sodium: 135 mmol/L (ref 135–145)

## 2019-06-15 LAB — RESPIRATORY PANEL BY RT PCR (FLU A&B, COVID)
Influenza A by PCR: NEGATIVE
Influenza B by PCR: NEGATIVE
SARS Coronavirus 2 by RT PCR: NEGATIVE

## 2019-06-15 LAB — TSH: TSH: 2.913 u[IU]/mL (ref 0.350–4.500)

## 2019-06-15 LAB — VITAMIN B12: Vitamin B-12: 160 pg/mL — ABNORMAL LOW (ref 180–914)

## 2019-06-15 LAB — HIV ANTIBODY (ROUTINE TESTING W REFLEX): HIV Screen 4th Generation wRfx: NONREACTIVE

## 2019-06-15 MED ORDER — ONDANSETRON HCL 4 MG PO TABS: 4.0000 mg | ORAL_TABLET | Freq: Four times a day (QID) | ORAL | Status: DC | PRN

## 2019-06-15 MED ORDER — TRAMADOL-ACETAMINOPHEN 37.5-325 MG PO TABS: 1.0000 | ORAL_TABLET | Freq: Four times a day (QID) | ORAL | Status: DC | PRN

## 2019-06-15 MED ORDER — LORAZEPAM 2 MG/ML IJ SOLN
0.5000 mg | Freq: Four times a day (QID) | INTRAMUSCULAR | Status: DC | PRN
  Administered 2019-06-15 – 2019-06-17 (×3): 0.5 mg via INTRAVENOUS
  Filled 2019-06-15 (×3): qty 1

## 2019-06-15 MED ORDER — SODIUM CHLORIDE 0.9 % IV SOLN
1.0000 g | INTRAVENOUS | Status: DC
  Administered 2019-06-16: 1 g via INTRAVENOUS
  Filled 2019-06-15 (×2): qty 10

## 2019-06-15 MED ORDER — ACETAMINOPHEN 650 MG RE SUPP: 650.0000 mg | Freq: Four times a day (QID) | RECTAL | Status: DC | PRN

## 2019-06-15 MED ORDER — ATORVASTATIN CALCIUM 10 MG PO TABS
10.0000 mg | ORAL_TABLET | Freq: Every day | ORAL | Status: DC
  Administered 2019-06-16 – 2019-06-19 (×4): 10 mg via ORAL
  Filled 2019-06-15 (×4): qty 1

## 2019-06-15 MED ORDER — ENOXAPARIN SODIUM 40 MG/0.4ML ~~LOC~~ SOLN
40.0000 mg | SUBCUTANEOUS | Status: DC
  Administered 2019-06-15 – 2019-06-18 (×4): 40 mg via SUBCUTANEOUS
  Filled 2019-06-15 (×4): qty 0.4

## 2019-06-15 MED ORDER — ACETAMINOPHEN 325 MG PO TABS
650.0000 mg | ORAL_TABLET | Freq: Four times a day (QID) | ORAL | Status: DC | PRN
  Administered 2019-06-17 – 2019-06-18 (×2): 650 mg via ORAL
  Filled 2019-06-15 (×2): qty 2

## 2019-06-15 MED ORDER — DOXAZOSIN MESYLATE 1 MG PO TABS
1.0000 mg | ORAL_TABLET | Freq: Two times a day (BID) | ORAL | Status: DC
  Administered 2019-06-16 (×2): 1 mg via ORAL
  Filled 2019-06-15 (×3): qty 1

## 2019-06-15 MED ORDER — TRAMADOL HCL 50 MG PO TABS
50.0000 mg | ORAL_TABLET | Freq: Four times a day (QID) | ORAL | Status: DC | PRN
  Administered 2019-06-16 – 2019-06-19 (×4): 50 mg via ORAL
  Filled 2019-06-15 (×4): qty 1

## 2019-06-15 MED ORDER — SODIUM CHLORIDE 0.9 % IV SOLN
1.0000 g | Freq: Once | INTRAVENOUS | Status: AC
  Administered 2019-06-15: 1 g via INTRAVENOUS
  Filled 2019-06-15: qty 10

## 2019-06-15 MED ORDER — OXYBUTYNIN CHLORIDE ER 5 MG PO TB24
5.0000 mg | ORAL_TABLET | Freq: Every day | ORAL | Status: DC
  Administered 2019-06-16 – 2019-06-18 (×4): 5 mg via ORAL
  Filled 2019-06-15 (×5): qty 1

## 2019-06-15 MED ORDER — ONDANSETRON HCL 4 MG/2ML IJ SOLN: 4.0000 mg | Freq: Four times a day (QID) | INTRAMUSCULAR | Status: DC | PRN

## 2019-06-15 MED ORDER — QUETIAPINE FUMARATE 25 MG PO TABS
12.5000 mg | ORAL_TABLET | Freq: Every day | ORAL | Status: DC
  Administered 2019-06-15 – 2019-06-18 (×4): 12.5 mg via ORAL
  Filled 2019-06-15 (×4): qty 1

## 2019-06-15 MED ORDER — ASPIRIN EC 81 MG PO TBEC
81.0000 mg | DELAYED_RELEASE_TABLET | Freq: Every day | ORAL | Status: DC
  Administered 2019-06-16 – 2019-06-19 (×4): 81 mg via ORAL
  Filled 2019-06-15 (×4): qty 1

## 2019-06-15 MED ORDER — LORAZEPAM 1 MG PO TABS
1.0000 mg | ORAL_TABLET | Freq: Once | ORAL | Status: AC
  Administered 2019-06-15: 1 mg via ORAL
  Filled 2019-06-15: qty 1

## 2019-06-15 NOTE — ED Notes (Signed)
Pt anxious at this time requesting to go home and getting out of the bed. Pt not redirectable and unable to follow directions from staff. Pt not steady on his feet and a fall risk

## 2019-06-15 NOTE — ED Notes (Signed)
Updated daughter Leeroy Bock with pt permission

## 2019-06-15 NOTE — ED Notes (Signed)
Patient transported to CT 
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
## 2019-06-15 NOTE — ED Notes (Addendum)
Urinal at bedside, pt aware of need for urine specimen. Attempted to return call to daughter chelsea X 2 and update her but no answer.

## 2019-06-15 NOTE — ED Triage Notes (Signed)
Pt caregiver went to check on him and reports he was c/o chest pain, denies pain now. Caregiver concerned about AMS. Pt only disoriented to year at this time.  Able to answer questions appropriately.

## 2019-06-15 NOTE — ED Notes (Signed)
Patient transported to MRI 
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
## 2019-06-15 NOTE — ED Notes (Signed)
Pt keeps trying to get up. Redirected. Pt is in hall in front of nursing station. Has not eaten meal tray given.

## 2019-06-15 NOTE — ED Notes (Signed)
Pt keeps hitting call bell.  Keeps asking nurse same question of why he is here and when he can go home.  Wanting to use restroom. Reminded pt of urinal on bedside.  Pt has used urinal in bed all morning without assistance but not remembering this.

## 2019-06-15 NOTE — ED Notes (Signed)
Pt attempting to climb out of bed. Pt states he has money to pay for a cab and is going home. Per primary RN, pt confused, unable to tell place, time, or situation. Pt asking repetitive questions, wants nurse to enter other pt's rooms to look for his belongings. Explained pt is in the hospital and needs to be admitted. Pt continues repeating same statements. Per EDP [place], ok to try 1mg  Ativan PO now. Message sent to attending MD via secure chat.

## 2019-06-15 NOTE — ED Notes (Signed)
Returned from MRI 

## 2019-06-16 ENCOUNTER — Other Ambulatory Visit: Payer: Self-pay

## 2019-06-16 DIAGNOSIS — M1990 Unspecified osteoarthritis, unspecified site: Secondary | ICD-10-CM | POA: Diagnosis present

## 2019-06-16 DIAGNOSIS — I10 Essential (primary) hypertension: Secondary | ICD-10-CM | POA: Diagnosis present

## 2019-06-16 DIAGNOSIS — Z79891 Long term (current) use of opiate analgesic: Secondary | ICD-10-CM | POA: Diagnosis not present

## 2019-06-16 DIAGNOSIS — Z8249 Family history of ischemic heart disease and other diseases of the circulatory system: Secondary | ICD-10-CM | POA: Diagnosis not present

## 2019-06-16 DIAGNOSIS — I739 Peripheral vascular disease, unspecified: Secondary | ICD-10-CM | POA: Diagnosis present

## 2019-06-16 DIAGNOSIS — F0281 Dementia in other diseases classified elsewhere with behavioral disturbance: Secondary | ICD-10-CM | POA: Diagnosis present

## 2019-06-16 DIAGNOSIS — E785 Hyperlipidemia, unspecified: Secondary | ICD-10-CM | POA: Diagnosis present

## 2019-06-16 DIAGNOSIS — R410 Disorientation, unspecified: Secondary | ICD-10-CM | POA: Diagnosis present

## 2019-06-16 DIAGNOSIS — Z87891 Personal history of nicotine dependence: Secondary | ICD-10-CM | POA: Diagnosis not present

## 2019-06-16 DIAGNOSIS — I674 Hypertensive encephalopathy: Secondary | ICD-10-CM | POA: Diagnosis present

## 2019-06-16 DIAGNOSIS — Z20822 Contact with and (suspected) exposure to covid-19: Secondary | ICD-10-CM | POA: Diagnosis present

## 2019-06-16 DIAGNOSIS — I69354 Hemiplegia and hemiparesis following cerebral infarction affecting left non-dominant side: Secondary | ICD-10-CM | POA: Diagnosis not present

## 2019-06-16 DIAGNOSIS — Z8673 Personal history of transient ischemic attack (TIA), and cerebral infarction without residual deficits: Secondary | ICD-10-CM | POA: Diagnosis not present

## 2019-06-16 DIAGNOSIS — G8929 Other chronic pain: Secondary | ICD-10-CM | POA: Diagnosis present

## 2019-06-16 DIAGNOSIS — Z981 Arthrodesis status: Secondary | ICD-10-CM | POA: Diagnosis not present

## 2019-06-16 DIAGNOSIS — Z79899 Other long term (current) drug therapy: Secondary | ICD-10-CM | POA: Diagnosis not present

## 2019-06-16 DIAGNOSIS — G309 Alzheimer's disease, unspecified: Principal | ICD-10-CM

## 2019-06-16 DIAGNOSIS — R079 Chest pain, unspecified: Secondary | ICD-10-CM | POA: Diagnosis not present

## 2019-06-16 DIAGNOSIS — Z7982 Long term (current) use of aspirin: Secondary | ICD-10-CM | POA: Diagnosis not present

## 2019-06-16 LAB — COMPREHENSIVE METABOLIC PANEL
ALT: 10 U/L (ref 0–44)
AST: 21 U/L (ref 15–41)
Albumin: 3.7 g/dL (ref 3.5–5.0)
Alkaline Phosphatase: 60 U/L (ref 38–126)
Anion gap: 9 (ref 5–15)
BUN: 5 mg/dL — ABNORMAL LOW (ref 8–23)
CO2: 22 mmol/L (ref 22–32)
Calcium: 8.6 mg/dL — ABNORMAL LOW (ref 8.9–10.3)
Chloride: 106 mmol/L (ref 98–111)
Creatinine, Ser: 0.78 mg/dL (ref 0.61–1.24)
GFR calc Af Amer: >60 mL/min (ref >60)
GFR calc non Af Amer: >60 mL/min (ref >60)
Glucose, Bld: 97 mg/dL (ref 70–99)
Potassium: 3.7 mmol/L (ref 3.5–5.1)
Sodium: 137 mmol/L (ref 135–145)
Total Bilirubin: 0.9 mg/dL (ref 0.3–1.2)
Total Protein: 6.7 g/dL (ref 6.5–8.1)

## 2019-06-16 LAB — CBC
HCT: 42.6 % (ref 39.0–52.0)
Hemoglobin: 13.8 g/dL (ref 13.0–17.0)
MCH: 28.8 pg (ref 26.0–34.0)
MCHC: 32.4 g/dL (ref 30.0–36.0)
MCV: 88.9 fL (ref 80.0–100.0)
Platelets: 160 10*3/uL (ref 150–400)
RBC: 4.79 MIL/uL (ref 4.22–5.81)
RDW: 14.5 % (ref 11.5–15.5)
WBC: 4.9 10*3/uL (ref 4.0–10.5)
nRBC: 0 % (ref 0.0–0.2)

## 2019-06-16 LAB — RPR: RPR Ser Ql: NONREACTIVE

## 2019-06-16 MED ORDER — HYDRALAZINE HCL 10 MG PO TABS
10.0000 mg | ORAL_TABLET | Freq: Three times a day (TID) | ORAL | Status: DC
  Administered 2019-06-16 – 2019-06-19 (×10): 10 mg via ORAL
  Filled 2019-06-16 (×11): qty 1

## 2019-06-16 MED ORDER — DOXAZOSIN MESYLATE 4 MG PO TABS
4.0000 mg | ORAL_TABLET | Freq: Every day | ORAL | Status: DC
  Administered 2019-06-16 – 2019-06-19 (×4): 4 mg via ORAL
  Filled 2019-06-16 (×4): qty 1

## 2019-06-16 NOTE — Evaluation (Signed)
Physical Therapy Evaluation Patient Details Name: Brandon [name] MRN: [id number] DOB: Dec 27, 1950 Today's Date: 06/16/2019   History of Present Illness  Brandon [name] is a 68yoM who comes to [facility] after acute onset AMS.  Clinical Impression  Pt admitted with above diagnosis. Pt currently with functional limitations due to the deficits listed below (see "PT Problem List"). Upon entry, pt in bed, awake and agreeable to participate. Pt anxious to get moving because his 'ride is here to pick him up' which is confabulation. The pt is alert and oriented to self, partially to location with additional processing time. Pt is  pleasant, conversational, and generally a fair historian, but is tangential and sometimes difficulty to get specific details. MinA to EOB, ModA to attempt STS twice. After 2nd it was determined likely that pt uses a hemi-walker on the Left for transfers, hence standing balance assessment this date may be subject to rater error. Functional mobility assessment demonstrates increased effort/time requirements, poor tolerance, and need for physical assistance, whereas the patient performed these at a higher level of independence PTA. I suspect that pt is typically fairly independent PTA, but in current state is weak, confused, and with poor awareness of current limitations which imposes safety concerns. A STR stay would afford a time to return to PLOF prior to return to home alone. Pt will benefit from skilled PT intervention to increase independence and safety with basic mobility in preparation for discharge to the venue listed below.       Follow Up Recommendations SNF;Supervision for mobility/OOB;Supervision - Intermittent    Equipment Recommendations  None recommended by PT    Recommendations for Other Services       Precautions / Restrictions Precautions Precautions: Fall Restrictions Weight Bearing Restrictions: No      Mobility  Bed Mobility Overal bed mobility: Needs  Assistance Bed Mobility: Sit to Supine;Supine to Sit     Supine to sit: Min assist Sit to supine: Min assist   General bed mobility comments: generally weak  Transfers Overall transfer level: Needs assistance Equipment used: 1 person hand held assist;Rolling walker (2 wheeled) Transfers: Sit to/from Stand Sit to Stand: Mod assist         General transfer comment: 2 attempts, pt likely would have done better with a hemiwalker, but still requires heavy assist to stand and maintain balance  Ambulation/Gait Ambulation/Gait assistance: (deferred, pt perseverating, unclear AMB baseline, fairly weak.)              Stairs            Wheelchair Mobility    Modified Rankin (Stroke Patients Only)       Balance Overall balance assessment: Needs assistance Sitting-balance support: Single extremity supported;Feet supported Sitting balance-Leahy Scale: Good     Standing balance support: Single extremity supported;During functional activity Standing balance-Leahy Scale: Poor Standing balance comment: should be attempted again with hemiwalker yielding to pt's baseline movement patterns.                             Pertinent Vitals/Pain Pain Assessment: No/denies pain    Home Living Family/patient expects to be discharged to:: Private residence Living Arrangements: Alone Available Help at Discharge: Family;Personal care attendant Type of Home: Apartment Home Access: Elevator;Level entry     Home Layout: One level Home Equipment: Environmental consultant - 2 wheels;Cane - single point;Crutches;Wheelchair - power;Bedside commode;Shower seat Additional Comments: uses electric WC in and out of house; Rt henmiplegia  d/t stroke    Prior Function Level of Independence: Independent with assistive device(s);Needs assistance   Gait / Transfers Assistance Needed: Unclear at this time; some use of a electric WC, and likely a hemiwalker based on conversation, but warrants further  investigation.  ADL's / Homemaking Assistance Needed: Aides help with meals/groceries        Hand Dominance        Extremity/Trunk Assessment   Upper Extremity Assessment Upper Extremity Assessment: Overall WFL for tasks assessed    Lower Extremity Assessment Lower Extremity Assessment: Overall WFL for tasks assessed       Communication   Communication: No difficulties  Cognition Arousal/Alertness: Awake/alert Behavior During Therapy: WFL for tasks assessed/performed Overall Cognitive Status: Within Functional Limits for tasks assessed                                        General Comments      Exercises     Assessment/Plan    PT Assessment Patient needs continued PT services  PT Problem List Decreased strength;Decreased cognition;Decreased activity tolerance;Decreased balance;Decreased mobility;Decreased coordination       PT Treatment Interventions DME instruction;Balance training;Gait training;Functional mobility training;Stair training;Therapeutic activities;Therapeutic exercise;Patient/family education;Cognitive remediation    PT Goals (Current goals can be found in the Care Plan section)  Acute Rehab PT Goals PT Goal Formulation: Patient unable to participate in goal setting Time For Goal Achievement: 06/30/19    Frequency Min 2X/week   Barriers to discharge Inaccessible home environment;Decreased caregiver support      Co-evaluation               AM-PAC PT "6 Clicks" Mobility  Outcome Measure Help needed turning from your back to your side while in a flat bed without using bedrails?: A Lot Help needed moving from lying on your back to sitting on the side of a flat bed without using bedrails?: A Little Help needed moving to and from a bed to a chair (including a wheelchair)?: A Lot Help needed standing up from a chair using your arms (e.g., wheelchair or bedside chair)?: A Lot Help needed to walk in hospital room?: A  Lot Help needed climbing 3-5 steps with a railing? : Total 6 Click Score: 12    End of Session   Activity Tolerance: Patient tolerated treatment well;Treatment limited secondary to agitation Patient left: in bed;with nursing/sitter in room;with call bell/phone within reach Nurse Communication: Mobility status PT Visit Diagnosis: Unsteadiness on feet (R26.81);Difficulty in walking, not elsewhere classified (R26.2);Muscle weakness (generalized) (M62.81)    Time: 9935-7017 PT Time Calculation (min) (ACUTE ONLY): 19 min   Charges:   PT Evaluation $PT Eval Low Complexity: 1 Low          12:48 PM, 06/16/19 Etta Grandchild, PT, DPT Physical Therapist - [facility]  [phone] ([place])   Blaze Sandin C 06/16/2019, 12:43 PM

## 2019-06-16 NOTE — ED Notes (Signed)
Pt given meal tray.

## 2019-06-16 NOTE — ED Notes (Signed)
Report to Elijah Birk, will call when room is clean.
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
## 2019-06-16 NOTE — Progress Notes (Signed)
PROGRESS NOTE  Brandon [name] KZS:[id number] DOB: Aug 09, 1950 DOA: 06/15/2019 PCP: Jodi Marble, MD  HPI/Recap of past 75 hours: 69 year old male with past medical history of Alzheimer's dementia, previous history of CVA and hypertension who lives in independent living with aides who come by at least once or twice a day and patient was brought into the emergency room on 1/15 felt that he was more confused as well as complaining of chest pain.  In the emergency room, patient's EKG initial cardiac markers unremarkable.  Blood pressures initially stable, started to trend upward.  Patient brought in for further evaluation.  This morning, patient is okay.  He knows that he is in the hospital, but he is confused and is not clear why he is here.  He denies any complaints  Assessment/Plan: Principal Problem: Him to be CC): No signs of any acute infection purposes.  No signs of acute CVA.  Possible he may have had some hypertensive encephalopathy.  Had extensive talk with his daughter.  She clarified that she talks to him almost daily and feels like in the last month or 2, his dementia has progressed.  She feels like he needs to change his living situation to assisted living.  Patient was evaluated by physical therapy who recommended short-term skilled nursing Active Problems:   History of stroke: No evidence of acute CVA at this time.  He does have some left-sided weakness from his previous stroke and stroke short-term skilled nursing is recommended.    Chest pain: No signs of acute ACS.  Enzymes have been negative as has EKG.    Hypertension: Blood pressures have been trending upward.  Change Cardura to his home dose.  Have started some scheduled low-dose hydralazine.   Code Status: Full code  Family Communication: Updated daughter by phone  Disposition Plan: Discharge to short-term skilled nursing, hopefully  Monday   Consultants:  None  Procedures:  None  Antimicrobials:  None  DVT prophylaxis: Lovenox   Objective: Vitals:   06/16/19 1218 06/16/19 1521  BP: (!) 143/86 (!) 160/80  Pulse: 77 84  Resp:  16  Temp: 98 F (36.7 C) 97.7 F (36.5 C)  SpO2: 99% 98%    Intake/Output Summary (Last 24 hours) at 06/16/2019 1555 Last data filed at 06/16/2019 1035 Gross per 24 hour  Intake --  Output 1000 ml  Net -1000 ml   Filed Weights   06/15/19 0958  Weight: 83.9 kg   Body mass index is 25.09 kg/m.  Exam:   General: Alert and oriented x2, no acute distress  Cardiovascular: Regular rate and rhythm, S1-S2  Respiratory: Clear to auscultation bilaterally  Abdomen: Soft, nontender, nondistended, positive bowel sounds  Musculoskeletal: No clubbing or cyanosis or edema  Skin: No skin breaks, tears or lesions  Psychiatry: Appropriate, with mild underlying dementia   Data Reviewed: CBC: Recent Labs  Lab 06/15/19 1013 06/16/19 0457  WBC 3.8* 4.9  HGB 13.1 13.8  HCT 40.9 42.6  MCV 90.3 88.9  PLT 150 732   Basic Metabolic Panel: Recent Labs  Lab 06/15/19 1129 06/16/19 0457  NA 135 137  K 4.2 3.7  CL 105 106  CO2 22 22  GLUCOSE 106* 97  BUN 7* 5*  CREATININE 0.80 0.78  CALCIUM 8.3* 8.6*   GFR: Estimated Creatinine Clearance: 94.3 mL/min (by C-G formula based on SCr of 0.78 mg/dL). Liver Function Tests: Recent Labs  Lab 06/15/19 1129 06/16/19 0457  AST 17 21  ALT 9 10  ALKPHOS 60 60  BILITOT 0.7 0.9  PROT 6.4* 6.7  ALBUMIN 3.5 3.7   No results for input(s): LIPASE, AMYLASE in the last 168 hours. No results for input(s): AMMONIA in the last 168 hours. Coagulation Profile: No results for input(s): INR, PROTIME in the last 168 hours. Cardiac Enzymes: No results for input(s): CKTOTAL, CKMB, CKMBINDEX, TROPONINI in the last 168 hours. BNP (last 3 results) No results for input(s): PROBNP in the last 8760 hours. HbA1C: No results for input(s):  HGBA1C in the last 72 hours. CBG: No results for input(s): GLUCAP in the last 168 hours. Lipid Profile: No results for input(s): CHOL, HDL, LDLCALC, TRIG, CHOLHDL, LDLDIRECT in the last 72 hours. Thyroid Function Tests: Recent Labs    06/15/19 1201  TSH 2.913   Anemia Panel: Recent Labs    06/15/19 1450  VITAMINB12 160*   Urine analysis:    Component Value Date/Time   COLORURINE YELLOW (A) 06/15/2019 1201   APPEARANCEUR HAZY (A) 06/15/2019 1201   LABSPEC 1.010 06/15/2019 1201   PHURINE 7.0 06/15/2019 1201   GLUCOSEU NEGATIVE 06/15/2019 1201   HGBUR NEGATIVE 06/15/2019 1201   BILIRUBINUR NEGATIVE 06/15/2019 1201   KETONESUR NEGATIVE 06/15/2019 1201   PROTEINUR NEGATIVE 06/15/2019 1201   NITRITE NEGATIVE 06/15/2019 1201   LEUKOCYTESUR MODERATE (A) 06/15/2019 1201   Sepsis Labs: @LABRCNTIP (procalcitonin:4,lacticidven:4)  ) Recent Results (from the past 240 hour(s))  Respiratory Panel by RT PCR (Flu A&B, Covid) - Nasopharyngeal Swab     Status: None   Collection Time: 06/15/19 11:08 AM   Specimen: Nasopharyngeal Swab  Result Value Ref Range Status   SARS Coronavirus 2 by RT PCR NEGATIVE NEGATIVE Final    Comment: (NOTE) SARS-CoV-2 target nucleic acids are NOT DETECTED. The SARS-CoV-2 RNA is generally detectable in upper respiratoy specimens during the acute phase of infection. The lowest concentration of SARS-CoV-2 viral copies this assay can detect is 131 copies/mL. A negative result does not preclude SARS-Cov-2 infection and should not be used as the sole basis for treatment or other patient management decisions. A negative result may occur with  improper specimen collection/handling, submission of specimen other than nasopharyngeal swab, presence of viral mutation(s) within the areas targeted by this assay, and inadequate number of viral copies (<131 copies/mL). A negative result must be combined with clinical observations, patient history, and epidemiological  information. The expected result is Negative. Fact Sheet for Patients:  06/17/19 Fact Sheet for Healthcare Providers:  https://www.moore.com/ This test is not yet ap proved or cleared by the https://www.young.biz/ FDA and  has been authorized for detection and/or diagnosis of SARS-CoV-2 by FDA under an Emergency Use Authorization (EUA). This EUA will remain  in effect (meaning this test can be used) for the duration of the COVID-19 declaration under Section 564(b)(1) of the Act, 21 U.S.C. section 360bbb-3(b)(1), unless the authorization is terminated or revoked sooner.    Influenza A by PCR NEGATIVE NEGATIVE Final   Influenza B by PCR NEGATIVE NEGATIVE Final    Comment: (NOTE) The Xpert Xpress SARS-CoV-2/FLU/RSV assay is intended as an aid in  the diagnosis of influenza from Nasopharyngeal swab specimens and  should not be used as a sole basis for treatment. Nasal washings and  aspirates are unacceptable for Xpert Xpress SARS-CoV-2/FLU/RSV  testing. Fact Sheet for Patients: [place] Fact Sheet for Healthcare Providers: https://www.moore.com/ This test is not yet approved or cleared by the https://www.young.biz/ FDA and  has been authorized for detection and/or diagnosis of SARS-CoV-2 by  FDA  under an Emergency Use Authorization (EUA). This EUA will remain  in effect (meaning this test can be used) for the duration of the  Covid-19 declaration under Section 564(b)(1) of the Act, 21  U.S.C. section 360bbb-3(b)(1), unless the authorization is  terminated or revoked. Performed at [facility], [address] [place], [place] [address]       Studies: No results found.  Scheduled Meds: . aspirin EC  81 mg Oral Daily  . atorvastatin  10 mg Oral Daily  . doxazosin  1 mg Oral BID  . enoxaparin (LOVENOX) injection  40 mg Subcutaneous Q24H  . oxybutynin  5 mg Oral QHS  . QUEtiapine   12.5 mg Oral QHS    Continuous Infusions:   LOS: 0 days     Hollice Espy, MD [facility]  To reach me or the doctor on call, go to: [email or web address] Password [facility]  06/16/2019, 3:55 PM

## 2019-06-16 NOTE — Evaluation (Signed)
Occupational Therapy Evaluation Patient Details Name: Brandon [name] MRN: [id number] DOB: [birth year] Today's Date: 06/16/2019    History of Present Illness Brandon [name] is a 68yoM who comes [facility] after acute onset AMS. Pt with Hx CVA with resultant R sided deficits.   Clinical Impression   Pt was seen for OT evaluation this date. Pt is poor historian, but he reports being Indep with self care, states he has aides that assist with cooking/cleaning/getting groceries. Pt also reports he used both hemi walker and power wheelchair for mobility. Pt reports he lives alone in an apartment. Currently pt demonstrates impairments as described below (See OT problem list) which functionally limit his ability to perform ADL/self-care tasks. Pt currently requires MIN/MOD A with LB dressing/bathing and MIN A for sit to stand from elevated surface.  Pt would benefit from skilled OT to address noted impairments and functional limitations (see below for any additional details) in order to maximize safety and independence while minimizing falls risk and caregiver burden.  Upon hospital discharge, recommend pt discharge to SNF to improve safety and independence with self care ADLs and ADL mobility.    Follow Up Recommendations  SNF    Equipment Recommendations  Other (comment)(defer to next level of care)    Recommendations for Other Services       Precautions / Restrictions Precautions Precautions: Fall Restrictions Weight Bearing Restrictions: No      Mobility Bed Mobility Overal bed mobility: Needs Assistance Bed Mobility: Sit to Supine;Supine to Sit     Supine to sit: Min assist Sit to supine: Min assist   General bed mobility comments: generally weak  Transfers Overall transfer level: Needs assistance Equipment used: Rolling walker (2 wheeled) Transfers: Sit to/from Stand Sit to Stand: Min assist         General transfer comment: Pt requires verbal cues for hand placement.     Balance Overall balance assessment: Needs assistance Sitting-balance support: Single extremity supported;Feet supported Sitting balance-Leahy Scale: Good     Standing balance support: Single extremity supported;During functional activity Standing balance-Leahy Scale: Poor Standing balance comment: pt requires MIN/MOD A to maintain static standing balance, is able to take L UE off walker temporarily to participate in clothing mgt over hips with elastic waist pants.                           ADL either performed or assessed with clinical judgement   ADL Overall ADL's : Needs assistance/impaired Eating/Feeding: Set up;Sitting   Grooming: Wash/dry hands;Wash/dry face;Set up;Oral care;Minimal assistance           Upper Body Dressing : Minimal assistance;Sitting   Lower Body Dressing: Minimal assistance;Moderate assistance;Sit to/from stand Lower Body Dressing Details (indicate cue type and reason): Pt requires MIN/MOD A to thread elastic waist pants while seated EOB. Stands with MIN A and able to use L UE to participate in donning pants. Toilet Transfer: Minimal assistance;Moderate assistance;Stand-pivot;BSC   Toileting- Clothing Manipulation and Hygiene: Moderate assistance;Maximal assistance;Sit to/from stand               Vision   Additional Comments: difficult to formally assess d/t confusion, pt primarily tracks appropriately for fxl tasks assessed.     Perception     Praxis      Pertinent Vitals/Pain Pain Assessment: No/denies pain     Hand Dominance     Extremity/Trunk Assessment Upper Extremity Assessment Upper Extremity Assessment: RUE deficits/detail;LUE deficits/detail RUE Deficits /  Details: contracted R elbow and shoulder, several digits contracted as well, pinky appears fixed with MCP extended and PIP flexed. RUE Coordination: decreased fine motor;decreased gross motor LUE Deficits / Details: shld, elbow, grip 4-/5   Lower Extremity  Assessment Lower Extremity Assessment: Defer to PT evaluation(clonus of LEs with positional change)       Communication Communication Communication: No difficulties   Cognition Arousal/Alertness: Awake/alert Behavior During Therapy: WFL for tasks assessed/performed Overall Cognitive Status: Impaired/Different from baseline Area of Impairment: Orientation;Attention;Memory;Following commands;Safety/judgement;Problem solving                 Orientation Level: Disoriented to;Place;Time;Situation Current Attention Level: Sustained Memory: Decreased short-term memory;Decreased recall of precautions Following Commands: Follows one step commands consistently;Follows multi-step commands inconsistently Safety/Judgement: Decreased awareness of safety   Problem Solving: Requires verbal cues;Requires tactile cues     General Comments       Exercises Other Exercises Other Exercises: OT engages pt in gentle AROM/PROM of R UE with end-range stretch x20 secs for elbow and shouler, within pt's toleranc. OT also provides gentle bicep tendon tissue mobilization to reduce tension and encourage circulation to improve  tolerance for elbow extension. Pt reports this does appear to reduce tension on his R side.   Shoulder Instructions      Home Living Family/patient expects to be discharged to:: Private residence Living Arrangements: Alone Available Help at Discharge: Family;Personal care attendant Type of Home: Apartment Home Access: Elevator;Level entry     Home Layout: One level               Home Equipment: Environmental consultant - 2 wheels;Cane - single point;Crutches;Wheelchair - power;Bedside commode;Shower seat   Additional Comments: pt is poor historian, states he uses hemi walker in his home, but later states he uses electric WC in and out of house; Rt hemiplegia d/t stroke      Prior Functioning/Environment Level of Independence: Independent with assistive device(s);Needs assistance   Gait / Transfers Assistance Needed: Unclear at this time, states he uses power chair and hemi walker. ADL's / Homemaking Assistance Needed: Pt reports to OT that he is indep with cooking, but had earlier reported to PT that he has aides help with cooking/cleaning/getting groceries. Pt reports completing self care I'ly.            OT Problem List: Decreased strength;Decreased range of motion;Decreased activity tolerance;Impaired balance (sitting and/or standing);Decreased safety awareness;Impaired UE functional use      OT Treatment/Interventions: Self-care/ADL training;Therapeutic exercise;DME and/or AE instruction;Therapeutic activities;Patient/family education;Balance training    OT Goals(Current goals can be found in the care plan section) Acute Rehab OT Goals Patient Stated Goal: to go home OT Goal Formulation: With patient Time For Goal Achievement: 06/30/19 Potential to Achieve Goals: Good  OT Frequency: Min 1X/week   Barriers to D/C: Decreased caregiver support          Co-evaluation              AM-PAC OT "6 Clicks" Daily Activity     Outcome Measure Help from another person eating meals?: A Little Help from another person taking care of personal grooming?: A Little Help from another person toileting, which includes using toliet, bedpan, or urinal?: A Little Help from another person bathing (including washing, rinsing, drying)?: A Little Help from another person to put on and taking off regular upper body clothing?: A Little Help from another person to put on and taking off regular lower body clothing?: A Little 6 Click Score: 18  End of Session Equipment Utilized During Treatment: Gait belt;Rolling walker Nurse Communication: Mobility status  Activity Tolerance: Patient tolerated treatment well Patient left: in bed;with call bell/phone within reach;with bed alarm set  OT Visit Diagnosis: Unsteadiness on feet (R26.81);Muscle weakness (generalized) (M62.81)                 Time: 8101-7510 OT Time Calculation (min): 44 min Charges:  OT General Charges $OT Visit: 1 Visit OT Evaluation $OT Eval Moderate Complexity: 1 Mod OT Treatments $Self Care/Home Management : 8-22 mins $Therapeutic Activity: 8-22 mins  Gerrianne Scale, MS, OTR/L ascom [phone] 06/16/19, 3:27 PM

## 2019-06-16 NOTE — ED Notes (Signed)
Daughter calls to check on father. Lives in [place]. Voices concerns father is currently living in independent living and showing signs of dementia. Feels he will need assisted living. Available for contact at [phone].

## 2019-06-16 NOTE — ED Notes (Signed)
Pt given urinal and able to use w/o assistance.

## 2019-06-17 LAB — URINE CULTURE

## 2019-06-17 NOTE — Progress Notes (Signed)
PROGRESS NOTE  Brandon [name] [id number]:[id number] DOB: [birth year] DOA: 06/15/2019 PCP: Sherron Monday, MD  HPI/Recap of past 10 hours: 69 year old male with past medical history of Alzheimer's dementia, previous history of CVA and hypertension who lives in independent living with aides who come by at least once or twice a day and patient was brought into the emergency room on 1/15 felt that he was more confused as well as complaining of chest pain.  In the emergency room, patient's EKG initial cardiac markers unremarkable.  Blood pressures initially stable, started to trend upward.  Patient brought in for further evaluation.  Over the next few days, patient more awake.  He struggles with issues of short-term memory.  No further complaints of chest pain and initial work-up was negative.  Seen by PT and OT who recommend skilled nursing.  Case management consulted.  Patient himself has no complaints  Assessment/Plan: Principal Problem: Him to be CC): No signs of any acute infection purposes.  No signs of acute CVA.  Possible he may have had some hypertensive encephalopathy.  Had extensive talk with his daughter on 1/16.  She clarified that she talks to him almost daily and feels like in the last month or 2, his dementia has progressed.  She feels like he needs to change his living situation to assisted living.  Patient was evaluated by physical therapy who recommended short-term skilled nursing Active Problems:   History of stroke: No evidence of acute CVA at this time.  He does have some left-sided weakness from his previous stroke and so, short-term skilled nursing is recommended.    Chest pain: No signs of acute ACS.  Enzymes have been negative as has EKG.    Hypertension: Blood pressures have been trending upward.  Change Cardura to his home dose.  Have started some scheduled low-dose hydralazine.  Blood pressure more stabilized by 1/17   Code Status: Full code  Family Communication: Updated  daughter by phone  Disposition Plan: Discharge to short-term skilled nursing, hopefully Monday   Consultants:  None  Procedures:  None  Antimicrobials:  None  DVT prophylaxis: Lovenox   Objective: Vitals:   06/16/19 2129 06/17/19 0548  BP:  127/86  Pulse:  83  Resp:  18  Temp: 97.9 F (36.6 C) 98.6 F (37 C)  SpO2:  99%    Intake/Output Summary (Last 24 hours) at 06/17/2019 1110 Last data filed at 06/17/2019 0240 Gross per 24 hour  Intake 240 ml  Output 1025 ml  Net -785 ml   Filed Weights   06/15/19 0958  Weight: 83.9 kg   Body mass index is 25.09 kg/m.  Exam:   General: Alert and oriented x2, no acute distress  Cardiovascular: Regular rate and rhythm, S1-S2  Respiratory: Clear to auscultation bilaterally  Abdomen: Soft, nontender, nondistended, positive bowel sounds  Musculoskeletal: No clubbing or cyanosis or edema  Skin: No skin breaks, tears or lesions  Psychiatry: Appropriate, with mild underlying dementia   Data Reviewed: CBC: Recent Labs  Lab 06/15/19 1013 06/16/19 0457  WBC 3.8* 4.9  HGB 13.1 13.8  HCT 40.9 42.6  MCV 90.3 88.9  PLT 150 160   Basic Metabolic Panel: Recent Labs  Lab 06/15/19 1129 06/16/19 0457  NA 135 137  K 4.2 3.7  CL 105 106  CO2 22 22  GLUCOSE 106* 97  BUN 7* 5*  CREATININE 0.80 0.78  CALCIUM 8.3* 8.6*   GFR: Estimated Creatinine Clearance: 94.3 mL/min (by C-G formula based  on SCr of 0.78 mg/dL). Liver Function Tests: Recent Labs  Lab 06/15/19 1129 06/16/19 0457  AST 17 21  ALT 9 10  ALKPHOS 60 60  BILITOT 0.7 0.9  PROT 6.4* 6.7  ALBUMIN 3.5 3.7   No results for input(s): LIPASE, AMYLASE in the last 168 hours. No results for input(s): AMMONIA in the last 168 hours. Coagulation Profile: No results for input(s): INR, PROTIME in the last 168 hours. Cardiac Enzymes: No results for input(s): CKTOTAL, CKMB, CKMBINDEX, TROPONINI in the last 168 hours. BNP (last 3 results) No results for  input(s): PROBNP in the last 8760 hours. HbA1C: No results for input(s): HGBA1C in the last 72 hours. CBG: No results for input(s): GLUCAP in the last 168 hours. Lipid Profile: No results for input(s): CHOL, HDL, LDLCALC, TRIG, CHOLHDL, LDLDIRECT in the last 72 hours. Thyroid Function Tests: Recent Labs    06/15/19 1201  TSH 2.913   Anemia Panel: Recent Labs    06/15/19 1450  VITAMINB12 160*   Urine analysis:    Component Value Date/Time   COLORURINE YELLOW (A) 06/15/2019 1201   APPEARANCEUR HAZY (A) 06/15/2019 1201   LABSPEC 1.010 06/15/2019 1201   PHURINE 7.0 06/15/2019 1201   GLUCOSEU NEGATIVE 06/15/2019 1201   HGBUR NEGATIVE 06/15/2019 1201   BILIRUBINUR NEGATIVE 06/15/2019 1201   KETONESUR NEGATIVE 06/15/2019 1201   PROTEINUR NEGATIVE 06/15/2019 1201   NITRITE NEGATIVE 06/15/2019 1201   LEUKOCYTESUR MODERATE (A) 06/15/2019 1201   Sepsis Labs: @LABRCNTIP (procalcitonin:4,lacticidven:4)  ) Recent Results (from the past 240 hour(s))  Respiratory Panel by RT PCR (Flu A&B, Covid) - Nasopharyngeal Swab     Status: None   Collection Time: 06/15/19 11:08 AM   Specimen: Nasopharyngeal Swab  Result Value Ref Range Status   SARS Coronavirus 2 by RT PCR NEGATIVE NEGATIVE Final    Comment: (NOTE) SARS-CoV-2 target nucleic acids are NOT DETECTED. The SARS-CoV-2 RNA is generally detectable in upper respiratoy specimens during the acute phase of infection. The lowest concentration of SARS-CoV-2 viral copies this assay can detect is 131 copies/mL. A negative result does not preclude SARS-Cov-2 infection and should not be used as the sole basis for treatment or other patient management decisions. A negative result may occur with  improper specimen collection/handling, submission of specimen other than nasopharyngeal swab, presence of viral mutation(s) within the areas targeted by this assay, and inadequate number of viral copies (<131 copies/mL). A negative result must be  combined with clinical observations, patient history, and epidemiological information. The expected result is Negative. Fact Sheet for Patients:  06/17/19 Fact Sheet for Healthcare Providers:  https://www.moore.com/ This test is not yet ap proved or cleared by the https://www.young.biz/ FDA and  has been authorized for detection and/or diagnosis of SARS-CoV-2 by FDA under an Emergency Use Authorization (EUA). This EUA will remain  in effect (meaning this test can be used) for the duration of the COVID-19 declaration under Section 564(b)(1) of the Act, 21 U.S.C. section 360bbb-3(b)(1), unless the authorization is terminated or revoked sooner.    Influenza A by PCR NEGATIVE NEGATIVE Final   Influenza B by PCR NEGATIVE NEGATIVE Final    Comment: (NOTE) The Xpert Xpress SARS-CoV-2/FLU/RSV assay is intended as an aid in  the diagnosis of influenza from Nasopharyngeal swab specimens and  should not be used as a sole basis for treatment. Nasal washings and  aspirates are unacceptable for Xpert Xpress SARS-CoV-2/FLU/RSV  testing. Fact Sheet for Patients: [place] Fact Sheet for Healthcare Providers: https://www.moore.com/ This test  is not yet approved or cleared by the [place] and  has been authorized for detection and/or diagnosis of SARS-CoV-2 by  FDA under an Emergency Use Authorization (EUA). This EUA will remain  in effect (meaning this test can be used) for the duration of the  Covid-19 declaration under Section 564(b)(1) of the Act, 21  U.S.C. section 360bbb-3(b)(1), unless the authorization is  terminated or revoked. Performed at [facility], [address] [place], [place] [address]   Urine Culture     Status: Abnormal   Collection Time: 06/15/19 12:01 PM   Specimen: Urine, Random  Result Value Ref Range Status   Specimen Description   Final    URINE, RANDOM  Performed at [facility], [address], [place], [place] [address]    Special Requests   Final    NONE Performed at [facility], [address] [place], [place] [address]    Culture MULTIPLE SPECIES PRESENT, SUGGEST RECOLLECTION (A)  Final   Report Status 06/17/2019 FINAL  Final      Studies: No results found.  Scheduled Meds: . aspirin EC  81 mg Oral Daily  . atorvastatin  10 mg Oral Daily  . doxazosin  4 mg Oral Daily  . enoxaparin (LOVENOX) injection  40 mg Subcutaneous Q24H  . hydrALAZINE  10 mg Oral Q8H  . oxybutynin  5 mg Oral QHS  . QUEtiapine  12.5 mg Oral QHS    Continuous Infusions:   LOS: 1 day     Annita Brod, MD [facility]  To reach me or the doctor on call, go to: [email or web address] Password [email or web address]  06/17/2019, 11:10 AM

## 2019-06-18 LAB — URINALYSIS, COMPLETE (UACMP) WITH MICROSCOPIC
Bacteria, UA: NONE SEEN
Bilirubin Urine: NEGATIVE
Glucose, UA: NEGATIVE mg/dL
Hgb urine dipstick: NEGATIVE
Ketones, ur: 20 mg/dL — AB
Nitrite: NEGATIVE
Protein, ur: NEGATIVE mg/dL
Specific Gravity, Urine: 1.018 (ref 1.005–1.030)
pH: 5 (ref 5.0–8.0)

## 2019-06-18 LAB — MRSA PCR SCREENING: MRSA by PCR: NEGATIVE

## 2019-06-18 NOTE — Progress Notes (Signed)
Nurse reports patient twith frequent urge ot void but bladder scan incomplete emptying  IN and out cath with repeat urinalysis ordered

## 2019-06-18 NOTE — Progress Notes (Signed)
PROGRESS NOTE  Brandon [name] [id number]:[id number] DOB: February 20, 1951 DOA: 06/15/2019 PCP: Sherron Monday, MD  HPI/Recap of past 35 hours: 69 year old male with past medical history of Alzheimer's dementia, previous history of CVA and hypertension who lives in independent living with aides who come by at least once or twice a day and patient was brought into the emergency room on 1/15 felt that he was more confused as well as complaining of chest pain.  In the emergency room, patient's EKG initial cardiac markers unremarkable.  Blood pressures initially stable, started to trend upward.  Patient brought in for further evaluation.  Over the next few days, patient more awake.  He struggles with issues of short-term memory.  No further complaints of chest pain and initial work-up was negative.  Seen by PT and OT who recommend skilled nursing.  Case management working to find a skilled nursing facility.  Patient himself has no complaints  Assessment/Plan: Principal Problem: Him to be CC): No signs of any acute infection purposes.  No signs of acute CVA.  Possible he may have had some hypertensive encephalopathy.  Had extensive talk with his daughter on 1/16.  She clarified that she talks to him almost daily and feels like in the last month or 2, his dementia has progressed.  She feels like he needs to change his living situation to assisted living.  Patient was evaluated by physical therapy who recommended short-term skilled nursing Active Problems:   History of stroke: No evidence of acute CVA at this time.  He does have some left-sided weakness from his previous stroke and so, short-term skilled nursing is recommended.    Chest pain: No signs of acute ACS.  Enzymes have been negative as has EKG.    Hypertension: Blood pressures have been trending upward.  Change Cardura to his home dose.  Have started some scheduled low-dose hydralazine.  Blood pressure more stabilized by 1/17   Code Status: Full  code  Family Communication: Left message for daughter  Disposition Plan: Discharge to short-term skilled nursing, hopefully Tuesday   Consultants:  None  Procedures:  None  Antimicrobials:  None  DVT prophylaxis: Lovenox   Objective: Vitals:   06/18/19 0901 06/18/19 1229  BP: 132/83 121/79  Pulse: 71 84  Resp: 20 18  Temp: 97.8 F (36.6 C) 98 F (36.7 C)  SpO2: 100% 100%    Intake/Output Summary (Last 24 hours) at 06/18/2019 1605 Last data filed at 06/18/2019 0552 Gross per 24 hour  Intake --  Output 320 ml  Net -320 ml   Filed Weights   06/15/19 0958  Weight: 83.9 kg   Body mass index is 25.09 kg/m.  Exam: No change from previous day  General: Alert and oriented x2, no acute distress  Cardiovascular: Regular rate and rhythm, S1-S2  Respiratory: Clear to auscultation bilaterally  Abdomen: Soft, nontender, nondistended, positive bowel sounds  Musculoskeletal: No clubbing or cyanosis or edema  Skin: No skin breaks, tears or lesions  Psychiatry: Appropriate, with mild underlying dementia   Data Reviewed: CBC: Recent Labs  Lab 06/15/19 1013 06/16/19 0457  WBC 3.8* 4.9  HGB 13.1 13.8  HCT 40.9 42.6  MCV 90.3 88.9  PLT 150 160   Basic Metabolic Panel: Recent Labs  Lab 06/15/19 1129 06/16/19 0457  NA 135 137  K 4.2 3.7  CL 105 106  CO2 22 22  GLUCOSE 106* 97  BUN 7* 5*  CREATININE 0.80 0.78  CALCIUM 8.3* 8.6*   GFR:  Estimated Creatinine Clearance: 94.3 mL/min (by C-G formula based on SCr of 0.78 mg/dL). Liver Function Tests: Recent Labs  Lab 06/15/19 1129 06/16/19 0457  AST 17 21  ALT 9 10  ALKPHOS 60 60  BILITOT 0.7 0.9  PROT 6.4* 6.7  ALBUMIN 3.5 3.7   No results for input(s): LIPASE, AMYLASE in the last 168 hours. No results for input(s): AMMONIA in the last 168 hours. Coagulation Profile: No results for input(s): INR, PROTIME in the last 168 hours. Cardiac Enzymes: No results for input(s): CKTOTAL, CKMB,  CKMBINDEX, TROPONINI in the last 168 hours. BNP (last 3 results) No results for input(s): PROBNP in the last 8760 hours. HbA1C: No results for input(s): HGBA1C in the last 72 hours. CBG: No results for input(s): GLUCAP in the last 168 hours. Lipid Profile: No results for input(s): CHOL, HDL, LDLCALC, TRIG, CHOLHDL, LDLDIRECT in the last 72 hours. Thyroid Function Tests: No results for input(s): TSH, T4TOTAL, FREET4, T3FREE, THYROIDAB in the last 72 hours. Anemia Panel: No results for input(s): VITAMINB12, FOLATE, FERRITIN, TIBC, IRON, RETICCTPCT in the last 72 hours. Urine analysis:    Component Value Date/Time   COLORURINE YELLOW (A) 06/18/2019 0217   APPEARANCEUR CLEAR (A) 06/18/2019 0217   LABSPEC 1.018 06/18/2019 0217   PHURINE 5.0 06/18/2019 0217   GLUCOSEU NEGATIVE 06/18/2019 0217   HGBUR NEGATIVE 06/18/2019 0217   BILIRUBINUR NEGATIVE 06/18/2019 0217   KETONESUR 20 (A) 06/18/2019 0217   PROTEINUR NEGATIVE 06/18/2019 0217   NITRITE NEGATIVE 06/18/2019 0217   LEUKOCYTESUR TRACE (A) 06/18/2019 0217   Sepsis Labs: @LABRCNTIP (procalcitonin:4,lacticidven:4)  ) Recent Results (from the past 240 hour(s))  Respiratory Panel by RT PCR (Flu A&B, Covid) - Nasopharyngeal Swab     Status: None   Collection Time: 06/15/19 11:08 AM   Specimen: Nasopharyngeal Swab  Result Value Ref Range Status   SARS Coronavirus 2 by RT PCR NEGATIVE NEGATIVE Final    Comment: (NOTE) SARS-CoV-2 target nucleic acids are NOT DETECTED. The SARS-CoV-2 RNA is generally detectable in upper respiratoy specimens during the acute phase of infection. The lowest concentration of SARS-CoV-2 viral copies this assay can detect is 131 copies/mL. A negative result does not preclude SARS-Cov-2 infection and should not be used as the sole basis for treatment or other patient management decisions. A negative result may occur with  improper specimen collection/handling, submission of specimen other than  nasopharyngeal swab, presence of viral mutation(s) within the areas targeted by this assay, and inadequate number of viral copies (<131 copies/mL). A negative result must be combined with clinical observations, patient history, and epidemiological information. The expected result is Negative. Fact Sheet for Patients:  PinkCheek.be Fact Sheet for Healthcare Providers:  GravelBags.it This test is not yet ap proved or cleared by the [place] FDA and  has been authorized for detection and/or diagnosis of SARS-CoV-2 by FDA under an Emergency Use Authorization (EUA). This EUA will remain  in effect (meaning this test can be used) for the duration of the COVID-19 declaration under Section 564(b)(1) of the Act, 21 U.S.C. section 360bbb-3(b)(1), unless the authorization is terminated or revoked sooner.    Influenza A by PCR NEGATIVE NEGATIVE Final   Influenza B by PCR NEGATIVE NEGATIVE Final    Comment: (NOTE) The Xpert Xpress SARS-CoV-2/FLU/RSV assay is intended as an aid in  the diagnosis of influenza from Nasopharyngeal swab specimens and  should not be used as a sole basis for treatment. Nasal washings and  aspirates are unacceptable for Xpert Xpress SARS-CoV-2/FLU/RSV  testing. Fact Sheet for Patients: https://www.moore.com/ Fact Sheet for Healthcare Providers: https://www.young.biz/ This test is not yet approved or cleared by the [place] FDA and  has been authorized for detection and/or diagnosis of SARS-CoV-2 by  FDA under an Emergency Use Authorization (EUA). This EUA will remain  in effect (meaning this test can be used) for the duration of the  Covid-19 declaration under Section 564(b)(1) of the Act, 21  U.S.C. section 360bbb-3(b)(1), unless the authorization is  terminated or revoked. Performed at [facility], [address], [place], [place] [address]   Urine  Culture     Status: Abnormal   Collection Time: 06/15/19 12:01 PM   Specimen: Urine, Random  Result Value Ref Range Status   Specimen Description   Final    URINE, RANDOM Performed at [facility], [address] [place], [place] [address]    Special Requests   Final    NONE Performed at [facility], [address] [place], [place] [address]    Culture MULTIPLE SPECIES PRESENT, SUGGEST RECOLLECTION (A)  Final   Report Status 06/17/2019 FINAL  Final  MRSA PCR Screening     Status: None   Collection Time: 06/17/19 10:53 PM   Specimen: Nasopharyngeal  Result Value Ref Range Status   MRSA by PCR NEGATIVE NEGATIVE Final    Comment:        The GeneXpert MRSA Assay (FDA approved for NASAL specimens only), is one component of a comprehensive MRSA colonization surveillance program. It is not intended to diagnose MRSA infection nor to guide or monitor treatment for MRSA infections. Performed at [facility], [address] [place], [place] [address]       Studies: No results found.  Scheduled Meds: . aspirin EC  81 mg Oral Daily  . atorvastatin  10 mg Oral Daily  . doxazosin  4 mg Oral Daily  . enoxaparin (LOVENOX) injection  40 mg Subcutaneous Q24H  . hydrALAZINE  10 mg Oral Q8H  . oxybutynin  5 mg Oral QHS  . QUEtiapine  12.5 mg Oral QHS    Continuous Infusions:   LOS: 2 days     Hollice Espy, MD [facility]  To reach me or the doctor on call, go to: [email or web address] Password [email or web address]  06/18/2019, 4:05 PM

## 2019-06-18 NOTE — NC FL2 (Signed)
[place] LEVEL OF CARE SCREENING TOOL     IDENTIFICATION  Patient Name: Brandon [name] Birthdate: June 13, 1950 Sex: male Admission Date (Current Location): 06/15/2019  [place] and [place] Number:  Engineering geologist and Address:  [facility], [address], [place], [place] [address]      Provider Number: [id number]  Attending Physician Name and Address:  Annita Brod, MD  Relative Name and Phone Number:       Current Level of Care: Hospital Recommended Level of Care: [place] Prior Approval Number:    Date Approved/Denied:   PASRR Number: [id number]  Discharge Plan: SNF    Current Diagnoses: Patient Active Problem List   Diagnosis Date Noted  . Hypertension   . Alzheimer's dementia with behavioral disturbance ([place]) 06/15/2019  . History of stroke 10/2005  . Chest pain 10/2005    Orientation RESPIRATION BLADDER Height & Weight     Self  Normal Continent Weight: 83.9 kg Height:  6' (182.9 cm)  BEHAVIORAL SYMPTOMS/MOOD NEUROLOGICAL BOWEL NUTRITION STATUS      Continent Diet(Regular)  AMBULATORY STATUS COMMUNICATION OF NEEDS Skin     Verbally Normal                       Personal Care Assistance Level of Assistance  Bathing, Dressing Bathing Assistance: Limited assistance   Dressing Assistance: Limited assistance     Functional Limitations Info  Hearing   Hearing Info: Impaired      SPECIAL CARE FACTORS FREQUENCY  PT (By licensed PT), OT (By licensed OT)                    Contractures Contractures Info: Not present    Additional Factors Info  Code Status Code Status Info: Full             Current Medications (06/18/2019):  This is the current hospital active medication list Current Facility-Administered Medications  Medication Dose Route Frequency Provider Last Rate Last Admin  . acetaminophen (TYLENOL) tablet 650 mg  650 mg Oral Q6H PRN Caren Griffins, MD   650  mg at 06/17/19 1630   Or  . acetaminophen (TYLENOL) suppository 650 mg  650 mg Rectal Q6H PRN Caren Griffins, MD      . aspirin EC tablet 81 mg  81 mg Oral Daily Caren Griffins, MD   81 mg at 06/17/19 0945  . atorvastatin (LIPITOR) tablet 10 mg  10 mg Oral Daily Caren Griffins, MD   10 mg at 06/17/19 0945  . doxazosin (CARDURA) tablet 4 mg  4 mg Oral Daily Annita Brod, MD   4 mg at 06/17/19 0945  . enoxaparin (LOVENOX) injection 40 mg  40 mg Subcutaneous Q24H Caren Griffins, MD   40 mg at 06/17/19 2122  . hydrALAZINE (APRESOLINE) tablet 10 mg  10 mg Oral Q8H Annita Brod, MD   10 mg at 06/18/19 0546  . LORazepam (ATIVAN) injection 0.5 mg  0.5 mg Intravenous Q6H PRN Caren Griffins, MD   0.5 mg at 06/17/19 0945  . ondansetron (ZOFRAN) tablet 4 mg  4 mg Oral Q6H PRN Caren Griffins, MD       Or  . ondansetron (ZOFRAN) injection 4 mg  4 mg Intravenous Q6H PRN Caren Griffins, MD      . oxybutynin (DITROPAN-XL) 24 hr tablet 5 mg  5 mg Oral QHS Gherghe, Costin M,  MD   5 mg at 06/17/19 2119  . QUEtiapine (SEROQUEL) tablet 12.5 mg  12.5 mg Oral QHS Leatha Gilding, MD   12.5 mg at 06/17/19 2119  . traMADol (ULTRAM) tablet 50 mg  50 mg Oral Q6H PRN Leatha Gilding, MD   50 mg at 06/17/19 1341     Discharge Medications: Please see discharge summary for a list of discharge medications.  Relevant Imaging Results:  Relevant Lab Results:   Additional Information    Trenton Founds, RN

## 2019-06-18 NOTE — TOC Initial Note (Signed)
Transition of Care [facility]) - Initial/Assessment Note    Patient Details  Name: Brandon [name] MRN: [id number] Date of Birth: 19-Feb-1951  Transition of Care [facility]) CM/SW Contact:    Brandon Ammons, RN Phone Number: 06/18/2019, 2:58 PM  Clinical Narrative:   RNCM assessed patient at bedside as well as placing call to patient's daughter due to patient's cognitive status. Patient is alert and pleasant but answering questions inconsistently and expresses wishes for this nurse to call his daughter. RNCM placed call and spoke with his daughter Brandon [name] who reports patient has lived independently up until now with assistance of personal caregivers however after this hospitalization she is very interested in moving him back up to [place] into an ALF facility near to where she lives. Daughter was agreeable to this CM initiating bed requests.  RNCM completed FL2, verified PASSR and sent out bed requests.  RNCM met with daughter and patient at bedside to follow up on any further questions which were answered appropriately.  RNCM placed return call to daughter around 2:30pm to present bed offers from [facility], she reported to this CM that she would return call.      Expected Discharge Plan: Skilled Nursing Facility Barriers to Discharge: Barriers Resolved   Patient Goals and CMS Choice Patient states their goals for this hospitalization and ongoing recovery are:: Per daughter to get him stronger and eventually into an ALF in [place] where she lives. CMS Medicare.gov Compare Post Acute Care list provided to:: Patient Represenative (must comment)(daughter) Choice offered to / list presented to : Adult Children  Expected Discharge Plan and Services Expected Discharge Plan: [place]   Discharge Planning Services: CM Consult Post Acute Care Choice: [place] Living arrangements for the past 2 months: [place]                                       Prior Living Arrangements/Services Living arrangements for the past 2 months: [place] Lives with:: Self Patient language and need for interpreter reviewed:: Yes Do you feel safe going back to the place where you live?: Yes      Need for Family Participation in Patient Care: Yes (Comment) Care giver support system in place?: Yes (comment) Current home services: Homehealth aide Criminal Activity/Legal Involvement Pertinent to Current Situation/Hospitalization: No - Comment as needed  Activities of Daily Living Home Assistive Devices/Equipment: None ADL Screening (condition at time of admission) Patient's cognitive ability adequate to safely complete daily activities?: No Is the patient deaf or have difficulty hearing?: No Does the patient have difficulty seeing, even when wearing glasses/contacts?: No Does the patient have difficulty concentrating, remembering, or making decisions?: Yes Patient able to express need for assistance with ADLs?: Yes Does the patient have difficulty dressing or bathing?: No Independently performs ADLs?: No Communication: Independent Dressing (OT): Needs assistance Is this a change from baseline?: Change from baseline, expected to last <3days Grooming: Independent Feeding: Independent Bathing: Needs assistance Is this a change from baseline?: Change from baseline, expected to last <3 days Toileting: Needs assistance Is this a change from baseline?: Change from baseline, expected to last <3 days In/Out Bed: Needs assistance Is this a change from baseline?: Change from baseline, expected to last <3 days Walks in Home: Independent Does the patient have difficulty walking or climbing stairs?: No Weakness of Legs: Right Weakness of Arms/Hands: Right  Permission Sought/Granted                  Emotional Assessment Appearance:: Appears stated age Attitude/Demeanor/Rapport: Inconsistent, Engaged Affect  (typically observed): Appropriate Orientation: : Oriented to Self, Oriented to Place Alcohol / Substance Use: Never Used Psych Involvement: No (comment)  Admission diagnosis:  Confusion [R41.0] Chest pain [R07.9] Urinary tract infection in male [P37.9] Acute metabolic encephalopathy [K24.09] Alzheimer's dementia with behavioral disturbance ([place]) [G30.9, F02.81] Patient Active Problem List   Diagnosis Date Noted  . Hypertension   . Alzheimer's dementia with behavioral disturbance ([place]) 06/15/2019  . History of stroke 10/2005  . Chest pain 10/2005   PCP:  Brandon Marble, MD Pharmacy:   [facility] [phone] [address], [place] AT [place] [address] Phone: [phone] Fax: [phone]     Social Determinants of Health (SDOH) Interventions    Readmission Risk Interventions No flowsheet data found.

## 2019-06-18 NOTE — Progress Notes (Signed)
Physical Therapy Treatment Patient Details Name: Brandon [name] MRN: [id number] DOB: 1950/06/27 Today's Date: 06/18/2019    History of Present Illness Brandon [name] is a 16yoM who comes to [facility] after acute onset AMS. Pt with Hx CVA with resultant R sided deficits.    PT Comments    Pt in bed agreeable to participate, DTR in room, who gives clarity to PLOF collected 2DA. Pt interactive, conversation, and pleasant, likely nearing baseline with some LT memory deficits. ModA to move to EOB, then practiced STS transfers and standing balance with HW. Pt also performs a SPT with HW. PT is fluent in use, but apparently hs not been using HW much since he finished with PT in 2018. Pt left up in chair at end of session. DTR reports pt uses power chair for all mobility, doesn;t really AMB much at all any more.    Follow Up Recommendations  SNF;Supervision for mobility/OOB;Supervision - Intermittent     Equipment Recommendations  None recommended by PT    Recommendations for Other Services       Precautions / Restrictions Precautions Precautions: Fall Restrictions Weight Bearing Restrictions: No    Mobility  Bed Mobility Overal bed mobility: Needs Assistance Bed Mobility: Supine to Sit     Supine to sit: Mod assist        Transfers Overall transfer level: Needs assistance Equipment used: Hemi-walker Transfers: Sit to/from [employer] Transfers Sit to Stand: Min guard Stand pivot transfers: Min guard          Ambulation/Gait Ambulation/Gait assistance: (deferred. Pt largely not ambulatory at baseline)               Stairs             Wheelchair Mobility    Modified Rankin (Stroke Patients Only)       Balance Overall balance assessment: Needs assistance;Mild deficits observed, not formally tested Sitting-balance support: No upper extremity supported;Feet supported Sitting balance-Leahy Scale: Good     Standing balance support: Single extremity  supported;During functional activity Standing balance-Leahy Scale: Fair Standing balance comment: good safety awareness and confidence                            Cognition Arousal/Alertness: Awake/alert Behavior During Therapy: WFL for tasks assessed/performed Overall Cognitive Status: History of cognitive impairments - at baseline                                 General Comments: aapears to be mostly back to bseline      Exercises Other Exercises Other Exercises: STS from EOB with hemiwalker c sustained standing x30sec; minGuard assist throughout, no LOB. high exersion to rise.    General Comments        Pertinent Vitals/Pain Pain Assessment: Faces Faces Pain Scale: Hurts little more Pain Location: Pain in Left quads (reports to have pulled a muscle) Pain Intervention(s): Limited activity within patient's tolerance;Monitored during session    Home Living                      Prior Function            PT Goals (current goals can now be found in the care plan section) Acute Rehab PT Goals Patient Stated Goal: to go home PT Goal Formulation: With patient/family Time For Goal Achievement: 06/30/19 Progress towards PT goals: Progressing  toward goals    Frequency    Min 2X/week      PT Plan Current plan remains appropriate    Co-evaluation              AM-PAC PT "6 Clicks" Mobility   Outcome Measure  Help needed turning from your back to your side while in a flat bed without using bedrails?: A Lot Help needed moving from lying on your back to sitting on the side of a flat bed without using bedrails?: A Little Help needed moving to and from a bed to a chair (including a wheelchair)?: A Lot Help needed standing up from a chair using your arms (e.g., wheelchair or bedside chair)?: A Lot Help needed to walk in hospital room?: A Lot Help needed climbing 3-5 steps with a railing? : Total 6 Click Score: 12    End of Session  Equipment Utilized During Treatment: Gait belt Activity Tolerance: Patient tolerated treatment well;Patient limited by pain Patient left: with nursing/sitter in room;with call bell/phone within reach;in chair;with family/visitor present Nurse Communication: Mobility status PT Visit Diagnosis: Unsteadiness on feet (R26.81);Difficulty in walking, not elsewhere classified (R26.2);Muscle weakness (generalized) (M62.81)     Time: 6283-6629 PT Time Calculation (min) (ACUTE ONLY): 25 min  Charges:  $Therapeutic Exercise: 23-37 mins                     2:40 PM, 06/18/19 Rosamaria Lints, PT, DPT Physical Therapist - [facility]  [phone] (ASCOM)     Kemora Pinard C 06/18/2019, 2:38 PM

## 2019-06-19 LAB — RESPIRATORY PANEL BY RT PCR (FLU A&B, COVID)
Influenza A by PCR: NEGATIVE
Influenza B by PCR: NEGATIVE
SARS Coronavirus 2 by RT PCR: NEGATIVE

## 2019-06-19 MED ORDER — GABAPENTIN 300 MG PO CAPS: 300.0000 mg | ORAL_CAPSULE | Freq: Three times a day (TID) | ORAL | 0 refills | Status: DC

## 2019-06-19 MED ORDER — ZOLPIDEM TARTRATE 5 MG PO TABS: 5.0000 mg | ORAL_TABLET | Freq: Every evening | ORAL | 0 refills | Status: DC | PRN

## 2019-06-19 MED ORDER — HYDRALAZINE HCL 10 MG PO TABS: 10.0000 mg | ORAL_TABLET | Freq: Three times a day (TID) | ORAL | 0 refills | Status: DC

## 2019-06-19 MED ORDER — TRAMADOL-ACETAMINOPHEN 37.5-325 MG PO TABS: 1.0000 | ORAL_TABLET | Freq: Four times a day (QID) | ORAL | 0 refills | Status: DC | PRN

## 2019-06-19 NOTE — Discharge Summary (Signed)
Discharge Summary  Brandon [name] [id number] DOB: 1951/03/18  PCP: Jodi Marble, MD  Admit date: 06/15/2019 Anticipated discharge date: 06/19/2019  Time spent: 25 minutes  Recommendations for Outpatient Follow-up:  1. Patient being discharged to short-term skilled nursing.  From there he will transition to assisted living. 2. New medication: Hydralazine 10 mg p.o. every 8 hours  Discharge Diagnoses:  Active Hospital Problems   Diagnosis Date Noted  . Alzheimer's dementia with behavioral disturbance ([place]) 06/15/2019  . Hypertension   . History of stroke 10/2005  . Chest pain 10/2005    Resolved Hospital Problems  No resolved problems to display.    Discharge Condition: Improved, being discharged to short-term skilled nursing prior to transitioning to assisted living  Diet recommendation: Low-sodium  Vitals:   06/19/19 0427 06/19/19 1244  BP: 123/89 111/83  Pulse: 71 77  Resp: 18 18  Temp: 98.3 F (36.8 C) 97.7 F (36.5 C)  SpO2: 99% 100%    History of present illness:  69 year old male with past medical history of Alzheimer's dementia, previous history of CVA and hypertension who lives in independent living with aides who come by at least once or twice a day and patient was brought into the emergency room on 1/15 felt that he was more confused as well as complaining of chest pain.  In the emergency room, patient's EKG & initial cardiac markers unremarkable.  Blood pressures initially stable, started to trend upward.  Patient brought in for further evaluation.   Hospital Course:  Principal Problem:   Alzheimer's dementia with behavioral disturbance ([place]): No signs of any acute infection purposes.  No signs of acute CVA.  Possible he may have had some hypertensive encephalopathy.  Had extensive talk with his daughter on 1/16.  She clarified that she talks to him almost daily and feels like in the last month or 2, his dementia has progressed.  She feels like he needs  to change his living situation to assisted living.  Patient was evaluated by physical therapy who recommended short-term skilled nursing.  Patient accepted and likely will go on 1/19 Active Problems:   History of stroke: No evidence of acute CVA at this time.  He does have some left-sided weakness from his previous stroke and so, short-term skilled nursing is recommended.    Chest pain: No signs of acute ACS.  Enzymes have been negative as has EKG.    Hypertension: Blood pressures have been trending upward.    Added low-dose hydralazine and blood pressure has since been in good control.   Procedures:  None  Consultations:  None  Discharge Exam: BP 111/83 (BP Location: Right Arm)   Pulse 77   Temp 97.7 F (36.5 C)   Resp 18   Ht 6' (1.829 m)   Wt 83.9 kg   SpO2 100%   BMI 25.09 kg/m   General: Oriented x2, although at times he forgets where he is at, no acute distress Cardiovascular: Regular rate and rhythm, S1-S2 Respiratory: Clear to auscultation bilaterally  Discharge Instructions You were cared for by a hospitalist during your hospital stay. If you have any questions about your discharge medications or the care you received while you were in the hospital after you are discharged, you can call the unit and asked to speak with the hospitalist on call if the hospitalist that took care of you is not available. Once you are discharged, your primary care physician will handle any further medical issues. Please note that NO REFILLS  for any discharge medications will be authorized once you are discharged, as it is imperative that you return to your primary care physician (or establish a relationship with a primary care physician if you do not have one) for your aftercare needs so that they can reassess your need for medications and monitor your lab values.   Allergies as of 06/19/2019   No Known Allergies     Medication List    STOP taking these medications   celecoxib 400 MG  capsule Commonly known as: CELEBREX     TAKE these medications   aspirin 81 MG tablet Take 81 mg by mouth daily.   atorvastatin 10 MG tablet Commonly known as: LIPITOR Take 10 mg by mouth daily.   doxazosin 4 MG tablet Commonly known as: CARDURA Take 4 mg by mouth daily.   gabapentin 300 MG capsule Commonly known as: NEURONTIN Take 1 capsule (300 mg total) by mouth 3 (three) times daily.   hydrALAZINE 10 MG tablet Commonly known as: APRESOLINE Take 1 tablet (10 mg total) by mouth every 8 (eight) hours.   oxybutynin 5 MG 24 hr tablet Commonly known as: DITROPAN-XL Take 5 mg by mouth at bedtime.   traMADol-acetaminophen 37.5-325 MG tablet Commonly known as: ULTRACET Take 1 tablet by mouth every 6 (six) hours as needed. What changed: reasons to take this   zolpidem 5 MG tablet Commonly known as: AMBIEN Take 1 tablet (5 mg total) by mouth at bedtime as needed for sleep. Every other night      No Known Allergies  Contact information for follow-up providers    Sherron Monday, MD Follow up in 2 month(s).   Specialty: Internal Medicine Contact information: [address] [place] [address] [phone]            Contact information for after-discharge care    Destination    [facility] .   Service: Skilled Nursing Contact information: [address] [place] [address] [phone]                   The results of significant diagnostics from this hospitalization (including imaging, microbiology, ancillary and laboratory) are listed below for reference.    Significant Diagnostic Studies: CT HEAD WO CONTRAST  Result Date: 06/15/2019 CLINICAL DATA:  Confusion/altered mental status EXAM: CT HEAD WITHOUT CONTRAST TECHNIQUE: Contiguous axial images were obtained from the base of the skull through the vertex without intravenous contrast. COMPARISON:  April 28, 2007 FINDINGS: Brain: There is mild  diffuse atrophy. There is no intracranial mass, hemorrhage, extra-axial fluid collection, or midline shift. There is slight small vessel disease in the centra semiovale bilaterally. Elsewhere brain parenchyma appears unremarkable. No evident acute infarct. Vascular: No hyperdense vessel. There is calcification in each carotid siphon region. Skull: Bony calvarium appears intact. Sinuses/Orbits: There is mild mucosal thickening in several ethmoid air cells. Other visualized paranasal sinuses are clear. Orbits appear symmetric bilaterally. Other: Mastoid air cells are clear. There is debris in the right external auditory canal. IMPRESSION: Mild diffuse atrophy. Mild periventricular small vessel disease. No acute infarct. No mass or hemorrhage. There are foci of arterial vascular calcification. There is mild mucosal thickening in several ethmoid air cells. There is probable cerumen in the right external auditory canal. Electronically Signed   By: Bretta Bang III M.D.   On: 06/15/2019 11:07   MR BRAIN WO CONTRAST  Result Date: 06/15/2019 CLINICAL DATA:  Encephalopathy.  Confusion.  Altered mental status. EXAM:  MRI HEAD WITHOUT CONTRAST TECHNIQUE: Multiplanar, multiecho pulse sequences of the brain and surrounding structures were obtained without intravenous contrast. COMPARISON:  Head CT same day.  MRI 11/09/2005 FINDINGS: Brain: Diffusion imaging does not show any acute or subacute infarction. There is an old infarction of the left para median pons. Chronic cerebellar atrophy. Cerebral hemispheres show generalized volume loss with mild small vessel change of the deep white matter. No large vessel territory infarction. No mass lesion, hemorrhage, hydrocephalus or extra-axial collection. Vascular: Major vessels at the base of the brain show flow. Skull and upper cervical spine: Negative Sinuses/Orbits: Clear/normal Other: None IMPRESSION: No acute finding. 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 This was acute  in 2007. Age related volume loss with minimal small vessel change of the hemispheric white matter. Electronically Signed   By: Paulina Fusi M.D.   On: 06/15/2019 15:49   DG Chest Port 1 View  Result Date: 06/15/2019 CLINICAL DATA:  Pt caregiver went to check on him and reports he was c/o chest pain, denies pain now. Caregiver concerned about AMS. Pt only disoriented to year at this time. Able to answer questions appropriately. Hx of stroke and HTN. Former smoker. EXAM: PORTABLE CHEST 1 VIEW COMPARISON:  09/01/2013. FINDINGS: Cardiac silhouette is normal in size. No mediastinal or hilar masses. Increased bronchovascular markings in the bases associated with mild linear/reticular opacities, the latter consistent with scarring, similar to the prior study. Lungs otherwise clear. No pleural effusion or pneumothorax. Skeletal structures are grossly intact. IMPRESSION: No active disease. Electronically Signed   By: Amie Portland M.D.   On: 06/15/2019 10:49    Microbiology: Recent Results (from the past 240 hour(s))  Respiratory Panel by RT PCR (Flu A&B, Covid) - Nasopharyngeal Swab     Status: None   Collection Time: 06/15/19 11:08 AM   Specimen: Nasopharyngeal Swab  Result Value Ref Range Status   SARS Coronavirus 2 by RT PCR NEGATIVE NEGATIVE Final    Comment: (NOTE) SARS-CoV-2 target nucleic acids are NOT DETECTED. The SARS-CoV-2 RNA is generally detectable in upper respiratoy specimens during the acute phase of infection. The lowest concentration of SARS-CoV-2 viral copies this assay can detect is 131 copies/mL. A negative result does not preclude SARS-Cov-2 infection and should not be used as the sole basis for treatment or other patient management decisions. A negative result may occur with  improper specimen collection/handling, submission of specimen other than nasopharyngeal swab, presence of viral mutation(s) within the areas targeted by this assay, and inadequate number of viral  copies (<131 copies/mL). A negative result must be combined with clinical observations, patient history, and epidemiological information. The expected result is Negative. Fact Sheet for Patients:  https://www.moore.com/ Fact Sheet for Healthcare Providers:  https://www.young.biz/ This test is not yet ap proved or cleared by the [place] FDA and  has been authorized for detection and/or diagnosis of SARS-CoV-2 by FDA under an Emergency Use Authorization (EUA). This EUA will remain  in effect (meaning this test can be used) for the duration of the COVID-19 declaration under Section 564(b)(1) of the Act, 21 U.S.C. section 360bbb-3(b)(1), unless the authorization is terminated or revoked sooner.    Influenza A by PCR NEGATIVE NEGATIVE Final   Influenza B by PCR NEGATIVE NEGATIVE Final    Comment: (NOTE) The Xpert Xpress SARS-CoV-2/FLU/RSV assay is intended as an aid in  the diagnosis of influenza from Nasopharyngeal swab specimens and  should not be used as a sole basis for treatment. Nasal washings and  aspirates  are unacceptable for Xpert Xpress SARS-CoV-2/FLU/RSV  testing. Fact Sheet for Patients: https://www.moore.com/ Fact Sheet for Healthcare Providers: https://www.young.biz/ This test is not yet approved or cleared by the [place] FDA and  has been authorized for detection and/or diagnosis of SARS-CoV-2 by  FDA under an Emergency Use Authorization (EUA). This EUA will remain  in effect (meaning this test can be used) for the duration of the  Covid-19 declaration under Section 564(b)(1) of the Act, 21  U.S.C. section 360bbb-3(b)(1), unless the authorization is  terminated or revoked. Performed at [facility], [address] [place], [place] [address]   Urine Culture     Status: Abnormal   Collection Time: 06/15/19 12:01 PM   Specimen: Urine, Random  Result Value Ref Range  Status   Specimen Description   Final    URINE, RANDOM Performed at [facility], [address] [place], [place] [address]    Special Requests   Final    NONE Performed at [facility], [address] [place], [place] [address]    Culture MULTIPLE SPECIES PRESENT, SUGGEST RECOLLECTION (A)  Final   Report Status 06/17/2019 FINAL  Final  MRSA PCR Screening     Status: None   Collection Time: 06/17/19 10:53 PM   Specimen: Nasopharyngeal  Result Value Ref Range Status   MRSA by PCR NEGATIVE NEGATIVE Final    Comment:        The GeneXpert MRSA Assay (FDA approved for NASAL specimens only), is one component of a comprehensive MRSA colonization surveillance program. It is not intended to diagnose MRSA infection nor to guide or monitor treatment for MRSA infections. Performed at [facility], [address] [place], [place] [address]      Labs: Basic Metabolic Panel: Recent Labs  Lab 06/15/19 1129 06/16/19 0457  NA 135 137  K 4.2 3.7  CL 105 106  CO2 22 22  GLUCOSE 106* 97  BUN 7* 5*  CREATININE 0.80 0.78  CALCIUM 8.3* 8.6*   Liver Function Tests: Recent Labs  Lab 06/15/19 1129 06/16/19 0457  AST 17 21  ALT 9 10  ALKPHOS 60 60  BILITOT 0.7 0.9  PROT 6.4* 6.7  ALBUMIN 3.5 3.7   No results for input(s): LIPASE, AMYLASE in the last 168 hours. No results for input(s): AMMONIA in the last 168 hours. CBC: Recent Labs  Lab 06/15/19 1013 06/16/19 0457  WBC 3.8* 4.9  HGB 13.1 13.8  HCT 40.9 42.6  MCV 90.3 88.9  PLT 150 160   Cardiac Enzymes: No results for input(s): CKTOTAL, CKMB, CKMBINDEX, TROPONINI in the last 168 hours. BNP: BNP (last 3 results) No results for input(s): BNP in the last 8760 hours.  ProBNP (last 3 results) No results for input(s): PROBNP in the last 8760 hours.  CBG: No results for input(s): GLUCAP in the last 168 hours.     Signed:  Hollice Espy, MD [facility] 06/19/2019, 12:57  PM

## 2019-06-19 NOTE — Progress Notes (Signed)
Pt discharged per mD order. IV removed. Pt trasnported to [facility] via EMS.

## 2019-06-19 NOTE — TOC Transition Note (Signed)
Transition of Care [facility]) - CM/SW Discharge Note   Patient Details  Name: Brandon [name] MRN: [id number] Date of Birth: 1951/02/19  Transition of Care [facility]) CM/SW Contact:  Trenton Founds, RN Phone Number: 06/19/2019, 2:20 PM   Clinical Narrative:   RNCM completed EMS paperwork. Patient transferring to room 506 at [facility], facility is aware of transfer. Bedside RN notified.     Final next level of care: Skilled Nursing Facility Barriers to Discharge: No Barriers Identified   Patient Goals and CMS Choice Patient states their goals for this hospitalization and ongoing recovery are:: Per daughter to get him stronger and eventually into an ALF in [place] where she lives. CMS Medicare.gov Compare Post Acute Care list provided to:: Patient Represenative (must comment)(daughter) Choice offered to / list presented to : Adult Children  Discharge Placement              Patient chooses bed at: [facility] Patient to be transferred to facility by: [facility] Name of family member notified: Neta Mends Patient and family notified of of transfer: 06/19/19  Discharge Plan and Services   Discharge Planning Services: CM Consult Post Acute Care Choice: Skilled Nursing Facility                               Social Determinants of Health (SDOH) Interventions     Readmission Risk Interventions No flowsheet data found.

## 2019-06-19 NOTE — Progress Notes (Signed)
EMS called for transport. Report called to [facility] at [facility]

## 2019-06-19 NOTE — Care Management Important Message (Signed)
Important Message  Patient Details  Name: Brandon [name] MRN: [id number] Date of Birth: 01/23/1951   Medicare Important Message Given:  Yes     [name] 06/19/2019, 10:56 AM

## 2019-07-04 ENCOUNTER — Emergency Department
Admission: EM | Admit: 2019-07-04 | Discharge: 2019-07-05 | Disposition: A | Discharge status: Skilled Nursing Facility | Payer: Medicare Other | Attending: Emergency Medicine | Admitting: Emergency Medicine

## 2019-07-04 DIAGNOSIS — Z7982 Long term (current) use of aspirin: Secondary | ICD-10-CM | POA: Insufficient documentation

## 2019-07-04 DIAGNOSIS — Z87891 Personal history of nicotine dependence: Secondary | ICD-10-CM | POA: Insufficient documentation

## 2019-07-04 DIAGNOSIS — Z79899 Other long term (current) drug therapy: Secondary | ICD-10-CM | POA: Insufficient documentation

## 2019-07-04 DIAGNOSIS — R410 Disorientation, unspecified: Secondary | ICD-10-CM | POA: Diagnosis present

## 2019-07-04 DIAGNOSIS — I10 Essential (primary) hypertension: Secondary | ICD-10-CM | POA: Insufficient documentation

## 2019-07-04 DIAGNOSIS — N390 Urinary tract infection, site not specified: Secondary | ICD-10-CM | POA: Diagnosis not present

## 2019-07-04 DIAGNOSIS — F0281 Dementia in other diseases classified elsewhere with behavioral disturbance: Secondary | ICD-10-CM | POA: Diagnosis not present

## 2019-07-04 DIAGNOSIS — G309 Alzheimer's disease, unspecified: Secondary | ICD-10-CM | POA: Diagnosis not present

## 2019-07-04 LAB — URINALYSIS, COMPLETE (UACMP) WITH MICROSCOPIC
Bilirubin Urine: NEGATIVE
Glucose, UA: NEGATIVE mg/dL
Hgb urine dipstick: NEGATIVE
Ketones, ur: 5 mg/dL — AB
Nitrite: POSITIVE — AB
Protein, ur: NEGATIVE mg/dL
Specific Gravity, Urine: 1.006 (ref 1.005–1.030)
pH: 6 (ref 5.0–8.0)

## 2019-07-04 LAB — BASIC METABOLIC PANEL
Anion gap: 10 (ref 5–15)
BUN: 7 mg/dL — ABNORMAL LOW (ref 8–23)
CO2: 22 mmol/L (ref 22–32)
Calcium: 8.9 mg/dL (ref 8.9–10.3)
Chloride: 103 mmol/L (ref 98–111)
Creatinine, Ser: 0.85 mg/dL (ref 0.61–1.24)
GFR calc Af Amer: >60 mL/min (ref >60)
GFR calc non Af Amer: >60 mL/min (ref >60)
Glucose, Bld: 102 mg/dL — ABNORMAL HIGH (ref 70–99)
Potassium: 4.3 mmol/L (ref 3.5–5.1)
Sodium: 135 mmol/L (ref 135–145)

## 2019-07-04 LAB — CBC
HCT: 41.6 % (ref 39.0–52.0)
Hemoglobin: 13.6 g/dL (ref 13.0–17.0)
MCH: 29.4 pg (ref 26.0–34.0)
MCHC: 32.7 g/dL (ref 30.0–36.0)
MCV: 90 fL (ref 80.0–100.0)
Platelets: 167 10*3/uL (ref 150–400)
RBC: 4.62 MIL/uL (ref 4.22–5.81)
RDW: 14.2 % (ref 11.5–15.5)
WBC: 2.8 10*3/uL — ABNORMAL LOW (ref 4.0–10.5)
nRBC: 0 % (ref 0.0–0.2)

## 2019-07-04 LAB — GLUCOSE, CAPILLARY: Glucose-Capillary: 91 mg/dL (ref 70–99)

## 2019-07-04 MED ORDER — CEPHALEXIN 500 MG PO CAPS
500.0000 mg | ORAL_CAPSULE | Freq: Three times a day (TID) | ORAL | Status: DC
  Administered 2019-07-05 (×3): 500 mg via ORAL
  Filled 2019-07-04 (×3): qty 1

## 2019-07-04 MED ORDER — ATORVASTATIN CALCIUM 20 MG PO TABS
10.0000 mg | ORAL_TABLET | Freq: Every day | ORAL | Status: DC
  Administered 2019-07-04: 10 mg via ORAL
  Filled 2019-07-04 (×2): qty 1

## 2019-07-04 MED ORDER — GABAPENTIN 300 MG PO CAPS
300.0000 mg | ORAL_CAPSULE | Freq: Three times a day (TID) | ORAL | Status: DC
  Administered 2019-07-04 – 2019-07-05 (×4): 300 mg via ORAL
  Filled 2019-07-04 (×5): qty 1

## 2019-07-04 MED ORDER — TRAZODONE HCL 50 MG PO TABS
150.0000 mg | ORAL_TABLET | Freq: Every day | ORAL | Status: DC
  Administered 2019-07-04: 150 mg via ORAL
  Filled 2019-07-04: qty 1

## 2019-07-04 MED ORDER — DOXAZOSIN MESYLATE 4 MG PO TABS
4.0000 mg | ORAL_TABLET | Freq: Every day | ORAL | Status: DC
  Administered 2019-07-04: 4 mg via ORAL
  Filled 2019-07-04 (×2): qty 1

## 2019-07-04 MED ORDER — HYDRALAZINE HCL 10 MG PO TABS
10.0000 mg | ORAL_TABLET | Freq: Three times a day (TID) | ORAL | Status: DC
  Administered 2019-07-04 – 2019-07-05 (×3): 10 mg via ORAL
  Filled 2019-07-04 (×6): qty 1

## 2019-07-04 MED ORDER — ASPIRIN EC 81 MG PO TBEC
81.0000 mg | DELAYED_RELEASE_TABLET | Freq: Every day | ORAL | Status: DC
  Administered 2019-07-04: 81 mg via ORAL
  Filled 2019-07-04 (×2): qty 1

## 2019-07-04 NOTE — ED Notes (Signed)
This RN provided pt with phone to call daughter.

## 2019-07-04 NOTE — ED Notes (Signed)
Pt able to use urinal at this time.

## 2019-07-04 NOTE — ED Triage Notes (Signed)
Pt arrived via [facility] from [facility] assisted living. Staff states that pt is not acting like himself. Pt was dx with dementia x1 month ago. Pt has not been taking his meds as well as some meds are missing. Pt was at [facility] for rehab but checked himself out early. [employer] has open case on pt.

## 2019-07-04 NOTE — ED Notes (Addendum)
Pt taken to toilet to void  with assitance EDT Mayra and this RN. Pt unsteady on his feet. Pt st he uses a walker. Pt refuses to provide a urine sample at this time.

## 2019-07-04 NOTE — ED Notes (Signed)
This RN messaged pharmacy requesting missing medication.  

## 2019-07-04 NOTE — ED Notes (Signed)
Pt provided with blankets and a sandwich tray/water. Pt does not voice any other needs at this time. This Rn will continue to monitor pt.

## 2019-07-04 NOTE — ED Notes (Signed)
Resumed care from [place] rn.  Pt awake and watching tv.

## 2019-07-04 NOTE — ED Notes (Signed)
Pt provided with a pillow and repositioned.

## 2019-07-04 NOTE — ED Notes (Signed)
Pt doesn't want food at this time. Pt given water cup. NAD.
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
## 2019-07-04 NOTE — ED Notes (Signed)
Pt provided with water

## 2019-07-04 NOTE — ED Notes (Signed)
[place] alarm placed and turned on.  

## 2019-07-05 DIAGNOSIS — G309 Alzheimer's disease, unspecified: Secondary | ICD-10-CM | POA: Diagnosis not present

## 2019-07-05 MED ORDER — CEFDINIR 300 MG PO CAPS: 300.0000 mg | ORAL_CAPSULE | Freq: Two times a day (BID) | ORAL | 0 refills | Status: AC

## 2019-07-05 NOTE — ED Notes (Signed)
Patient requested his wallet which was in his hoodie. Patient has his own cell phone and wanted a phone number from his wallet. Patient was informed of his emergency contacts, but didn't want to speak with them. Patient was given his glasses if needed.

## 2019-07-05 NOTE — ED Notes (Signed)
Patient is eating lunch. Patient states he can call a cousin for a ride home, but phone is at 1%. No charging cord is available, but will assist patient to call relatives when discharged.

## 2019-07-05 NOTE — ED Notes (Signed)
Pt sleeping. 
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
## 2019-07-05 NOTE — Care Management (Addendum)
TOC RN CM: Patient set up with Feliberto Gottron for [facility].    LVMM with [facility]  [phone] updating that patient discharging home with [facility].   LVMM for daughter, Leeroy Bock @ [phone] updating that patient discharged home with [facility].

## 2019-07-05 NOTE — ED Provider Notes (Signed)
Patient's work up reveals UTI - will treat. Otherwise no actionable derangements.   Otherwise, on exam he is alert and oriented to self, place, year, birthday. There does not seem to be any psychiatric or emergent medical condition on which he is appropriate for IVC or hold for further treatment.   CM/SW will work on improving home health resources at home, and will plan for DC with Rx for UTI.    [name]., MD 07/05/19 [phone]

## 2019-07-05 NOTE — ED Notes (Signed)
VOL/ CM worked on setting up resources needed for patient to be discharged safetly

## 2019-07-05 NOTE — TOC Initial Note (Signed)
Transition of Care [facility]) - Initial/Assessment Note    Patient Details  Name: Brandon [name] MRN: [id number] Date of Birth: Jan 14, 1951  Transition of Care [facility]) CM/SW Contact:    Beckley Cellar, RN Phone Number: 07/05/2019, 1:45 PM  Clinical Narrative:                 Spoke with patient's daughter who expressed concerns related to patient returning to his independent living facility with aide services for 6 hours daily. Daughter is seeking guardianship and power of attorney in an attempt to move patient closer to her in [place]. Patient has a card in his wallet from [facility] with Marletta Lor, BSW, CP-DA Case manager [phone]. RN CM called and spoke with Marylene Land @ [facility] care manager who advised patient was not safe to discharge home and would need placement as his aides have reported he is not taking his medication correctly and his having increased confusion. Daughter has filed for guardianship through APS.    Expected Discharge Plan: Assisted Living     Patient Goals and CMS Choice Patient states their goals for this hospitalization and ongoing recovery are:: Daughter is trying to get him closer to [place] for ALF.      Expected Discharge Plan and Services Expected Discharge Plan: Assisted Living       Living arrangements for the past 2 months: Independent Living Facility                                      Prior Living Arrangements/Services Living arrangements for the past 2 months: Independent Living Facility Lives with:: Self Patient language and need for interpreter reviewed:: Yes Do you feel safe going back to the place where you live?: Yes      Need for Family Participation in Patient Care: Yes (Comment) Care giver support system in place?: Yes (comment) Current home services: Homehealth aide(Has aide 6 hours daily) Criminal Activity/Legal Involvement Pertinent to Current Situation/Hospitalization: No - Comment as needed  Activities of  Daily Living      Permission Sought/Granted                  Emotional Assessment Appearance:: Appears stated age Attitude/Demeanor/Rapport: Guarded Affect (typically observed): Agitated(wanting to discharge home) Orientation: : Oriented to Self, Oriented to Place, Oriented to Situation, Oriented to  Time Alcohol / Substance Use: Never Used Psych Involvement: No (comment)  Admission diagnosis:  ams Patient Active Problem List   Diagnosis Date Noted  . Hypertension   . Alzheimer's dementia with behavioral disturbance (HCC) 06/15/2019  . History of stroke 10/2005  . Chest pain 10/2005   PCP:  Sherron Monday, MD Pharmacy:   [facility] [phone] [place], [place] - [address] AT [facility] OF [address] [place] [address] Phone: [phone] Fax: [phone]     Social Determinants of Health (SDOH) Interventions    Readmission Risk Interventions No flowsheet data found.

## 2019-07-05 NOTE — Care Management (Addendum)
TOC RN CM: Call to daughter who advised patient is not safe to be discharged home as he is having increased confusion and she is attempting to gain guardianship to place in ALF closer to her. Patient has aide services that can come a max of 8 hours daily for 5 days a week however patient is not safe to be home alone.   TOC RN CM: Patient requesting to go home and become agitated that he is not allowed to leave. RN CM met with patient and expressed family concerns regarding medication management. Patient alert and oriented x4 and able to explain in detail his concerns and needs. Patient is not agreeable to staying longer and not interested in placement however did agree to home health for medication management. Patient states he has a pill box at home and just needs some help getting it organized since the pharmacy changed the colors of his pills and got him confused. RN CM discussed with doctor and will arrange home health for nursing and physical therapy at discharge.

## 2019-07-05 NOTE — ED Notes (Signed)
Pt given meal tray by this RN. Pt repositioned in bed by this RN to eat. Pt alert, appropriate and calm. Pt refused bath from this RN. Urinal empted and placed within reach of patient. Pt tolerated well. Remote given to patient and TV turned on for patient. Pt denies further needs. Call bell within reach at this time.

## 2019-07-05 NOTE — ED Notes (Signed)
Dr. Monks at bedside 

## 2019-07-05 NOTE — ED Notes (Signed)
VS obtained by this RN. Pt remains calm, cooperative and pleasant at this time. Urinal emptied and placed back within reach of patient. Pt denies further needs at this time. Call bell remains within reach at this time.

## 2019-07-05 NOTE — Discharge Instructions (Addendum)
Thank you for letting us take care of you in the emergency department today.   Please continue to take any regular, prescribed medications.   New medications we have prescribed:  - Antibiotics for your urine infection  Please follow up with: - Your primary care doctor to review your ER visit and follow up on your symptoms.   Please return to the ER for any new or worsening symptoms.

## 2019-07-05 NOTE — ED Notes (Addendum)
Patient has cards from [facility], Marletta Lor Case manager whom he says he receives support from and Dr. Sherron Monday who is his doctor. Information was given to Dagoberto Reef Case manager in the ED.

## 2019-07-05 NOTE — ED Notes (Signed)
Message was left for Darl Pikes from Case management.

## 2019-07-06 LAB — URINE CULTURE

## 2020-09-26 DIAGNOSIS — I679 Cerebrovascular disease, unspecified: Secondary | ICD-10-CM | POA: Diagnosis not present

## 2020-09-26 DIAGNOSIS — E785 Hyperlipidemia, unspecified: Secondary | ICD-10-CM | POA: Diagnosis not present

## 2020-09-26 DIAGNOSIS — G8192 Hemiplegia, unspecified affecting left dominant side: Secondary | ICD-10-CM | POA: Diagnosis not present

## 2020-10-13 ENCOUNTER — Inpatient Hospital Stay: Payer: Medicare (Managed Care)

## 2020-10-13 ENCOUNTER — Emergency Department: Payer: Medicare (Managed Care)

## 2020-10-13 ENCOUNTER — Inpatient Hospital Stay
Admission: EM | Admit: 2020-10-13 | Discharge: 2020-10-22 | DRG: 689 | Disposition: A | Discharge status: Skilled Nursing Facility | Payer: Medicare (Managed Care) | Source: Skilled Nursing Facility | Attending: Internal Medicine | Admitting: Internal Medicine

## 2020-10-13 DIAGNOSIS — E785 Hyperlipidemia, unspecified: Secondary | ICD-10-CM | POA: Diagnosis present

## 2020-10-13 DIAGNOSIS — N390 Urinary tract infection, site not specified: Secondary | ICD-10-CM | POA: Diagnosis present

## 2020-10-13 DIAGNOSIS — G9341 Metabolic encephalopathy: Secondary | ICD-10-CM

## 2020-10-13 DIAGNOSIS — Z87891 Personal history of nicotine dependence: Secondary | ICD-10-CM

## 2020-10-13 DIAGNOSIS — N3001 Acute cystitis with hematuria: Secondary | ICD-10-CM

## 2020-10-13 DIAGNOSIS — Z8673 Personal history of transient ischemic attack (TIA), and cerebral infarction without residual deficits: Secondary | ICD-10-CM

## 2020-10-13 DIAGNOSIS — G309 Alzheimer's disease, unspecified: Secondary | ICD-10-CM | POA: Diagnosis present

## 2020-10-13 DIAGNOSIS — Z79899 Other long term (current) drug therapy: Secondary | ICD-10-CM | POA: Diagnosis not present

## 2020-10-13 DIAGNOSIS — B9620 Unspecified Escherichia coli [E. coli] as the cause of diseases classified elsewhere: Secondary | ICD-10-CM | POA: Diagnosis present

## 2020-10-13 DIAGNOSIS — Z981 Arthrodesis status: Secondary | ICD-10-CM

## 2020-10-13 DIAGNOSIS — F0390 Unspecified dementia without behavioral disturbance: Secondary | ICD-10-CM | POA: Diagnosis not present

## 2020-10-13 DIAGNOSIS — I69351 Hemiplegia and hemiparesis following cerebral infarction affecting right dominant side: Secondary | ICD-10-CM | POA: Diagnosis not present

## 2020-10-13 DIAGNOSIS — E538 Deficiency of other specified B group vitamins: Secondary | ICD-10-CM | POA: Diagnosis present

## 2020-10-13 DIAGNOSIS — I10 Essential (primary) hypertension: Secondary | ICD-10-CM | POA: Diagnosis present

## 2020-10-13 DIAGNOSIS — G629 Polyneuropathy, unspecified: Secondary | ICD-10-CM | POA: Diagnosis present

## 2020-10-13 DIAGNOSIS — N3000 Acute cystitis without hematuria: Secondary | ICD-10-CM | POA: Diagnosis present

## 2020-10-13 DIAGNOSIS — F0280 Dementia in other diseases classified elsewhere without behavioral disturbance: Secondary | ICD-10-CM | POA: Diagnosis present

## 2020-10-13 DIAGNOSIS — Z20822 Contact with and (suspected) exposure to covid-19: Secondary | ICD-10-CM | POA: Diagnosis present

## 2020-10-13 DIAGNOSIS — R531 Weakness: Secondary | ICD-10-CM

## 2020-10-13 DIAGNOSIS — Z8249 Family history of ischemic heart disease and other diseases of the circulatory system: Secondary | ICD-10-CM | POA: Diagnosis not present

## 2020-10-13 DIAGNOSIS — R001 Bradycardia, unspecified: Secondary | ICD-10-CM | POA: Diagnosis present

## 2020-10-13 LAB — CBC WITH DIFFERENTIAL/PLATELET
Abs Immature Granulocytes: 0.01 10*3/uL (ref 0.00–0.07)
Basophils Absolute: 0 10*3/uL (ref 0.0–0.1)
Basophils Relative: 0 %
Eosinophils Absolute: 0 10*3/uL (ref 0.0–0.5)
Eosinophils Relative: 1 %
HCT: 45 % (ref 39.0–52.0)
Hemoglobin: 14.5 g/dL (ref 13.0–17.0)
Immature Granulocytes: 0 %
Lymphocytes Relative: 23 %
Lymphs Abs: 1 10*3/uL (ref 0.7–4.0)
MCH: 29.1 pg (ref 26.0–34.0)
MCHC: 32.2 g/dL (ref 30.0–36.0)
MCV: 90.4 fL (ref 80.0–100.0)
Monocytes Absolute: 0.5 10*3/uL (ref 0.1–1.0)
Monocytes Relative: 12 %
Neutro Abs: 2.8 10*3/uL (ref 1.7–7.7)
Neutrophils Relative %: 64 %
Platelets: 142 10*3/uL — ABNORMAL LOW (ref 150–400)
RBC: 4.98 MIL/uL (ref 4.22–5.81)
RDW: 13.7 % (ref 11.5–15.5)
WBC: 4.4 10*3/uL (ref 4.0–10.5)
nRBC: 0 % (ref 0.0–0.2)

## 2020-10-13 LAB — RESP PANEL BY RT-PCR (FLU A&B, COVID) ARPGX2
Influenza A by PCR: NEGATIVE
Influenza B by PCR: NEGATIVE
SARS Coronavirus 2 by RT PCR: NEGATIVE

## 2020-10-13 LAB — URINALYSIS, COMPLETE (UACMP) WITH MICROSCOPIC
Bilirubin Urine: NEGATIVE
Glucose, UA: NEGATIVE mg/dL
Hgb urine dipstick: NEGATIVE
Ketones, ur: NEGATIVE mg/dL
Nitrite: NEGATIVE
Protein, ur: NEGATIVE mg/dL
Specific Gravity, Urine: 1.011 (ref 1.005–1.030)
WBC, UA: >50 WBC/hpf — ABNORMAL HIGH (ref 0–5)
pH: 8 (ref 5.0–8.0)

## 2020-10-13 LAB — BASIC METABOLIC PANEL
Anion gap: 9 (ref 5–15)
BUN: 8 mg/dL (ref 8–23)
CO2: 24 mmol/L (ref 22–32)
Calcium: 8.8 mg/dL — ABNORMAL LOW (ref 8.9–10.3)
Chloride: 104 mmol/L (ref 98–111)
Creatinine, Ser: 0.96 mg/dL (ref 0.61–1.24)
GFR, Estimated: >60 mL/min (ref >60)
Glucose, Bld: 117 mg/dL — ABNORMAL HIGH (ref 70–99)
Potassium: 3.7 mmol/L (ref 3.5–5.1)
Sodium: 137 mmol/L (ref 135–145)

## 2020-10-13 LAB — TSH: TSH: 2.103 u[IU]/mL (ref 0.350–4.500)

## 2020-10-13 MED ORDER — GABAPENTIN 300 MG PO CAPS
300.0000 mg | ORAL_CAPSULE | Freq: Three times a day (TID) | ORAL | Status: DC
  Administered 2020-10-13 – 2020-10-22 (×27): 300 mg via ORAL
  Filled 2020-10-13 (×27): qty 1

## 2020-10-13 MED ORDER — ALBUTEROL SULFATE (2.5 MG/3ML) 0.083% IN NEBU: 2.5000 mg | INHALATION_SOLUTION | Freq: Four times a day (QID) | RESPIRATORY_TRACT | Status: DC | PRN

## 2020-10-13 MED ORDER — HALOPERIDOL LACTATE 5 MG/ML IJ SOLN
2.0000 mg | Freq: Once | INTRAMUSCULAR | Status: AC
  Administered 2020-10-13: 23:00:00 2 mg via INTRAVENOUS
  Filled 2020-10-13: qty 1

## 2020-10-13 MED ORDER — DOXAZOSIN MESYLATE 4 MG PO TABS
4.0000 mg | ORAL_TABLET | Freq: Every day | ORAL | Status: DC
  Administered 2020-10-14 – 2020-10-22 (×9): 4 mg via ORAL
  Filled 2020-10-13 (×9): qty 1

## 2020-10-13 MED ORDER — HYDRALAZINE HCL 50 MG PO TABS
25.0000 mg | ORAL_TABLET | Freq: Three times a day (TID) | ORAL | Status: DC
  Administered 2020-10-13 – 2020-10-22 (×24): 25 mg via ORAL
  Filled 2020-10-13 (×27): qty 1

## 2020-10-13 MED ORDER — SODIUM CHLORIDE 0.9 % IV SOLN
1.0000 g | Freq: Once | INTRAVENOUS | Status: AC
  Administered 2020-10-13: 1 g via INTRAVENOUS
  Filled 2020-10-13: qty 10

## 2020-10-13 MED ORDER — ASPIRIN EC 81 MG PO TBEC
81.0000 mg | DELAYED_RELEASE_TABLET | Freq: Every day | ORAL | Status: DC
  Administered 2020-10-14 – 2020-10-22 (×9): 81 mg via ORAL
  Filled 2020-10-13 (×9): qty 1

## 2020-10-13 MED ORDER — ATORVASTATIN CALCIUM 20 MG PO TABS
10.0000 mg | ORAL_TABLET | Freq: Every day | ORAL | Status: DC
  Administered 2020-10-14 – 2020-10-22 (×9): 10 mg via ORAL
  Filled 2020-10-13 (×9): qty 1

## 2020-10-13 MED ORDER — ENOXAPARIN SODIUM 40 MG/0.4ML IJ SOSY
40.0000 mg | PREFILLED_SYRINGE | INTRAMUSCULAR | Status: DC
  Administered 2020-10-13 – 2020-10-21 (×9): 40 mg via SUBCUTANEOUS
  Filled 2020-10-13 (×9): qty 0.4

## 2020-10-13 MED ORDER — SODIUM CHLORIDE 0.9 % IV BOLUS
500.0000 mL | Freq: Once | INTRAVENOUS | Status: AC
  Administered 2020-10-13: 500 mL via INTRAVENOUS

## 2020-10-13 MED ORDER — OXYBUTYNIN CHLORIDE ER 5 MG PO TB24
5.0000 mg | ORAL_TABLET | Freq: Every day | ORAL | Status: DC
  Administered 2020-10-13 – 2020-10-21 (×9): 5 mg via ORAL
  Filled 2020-10-13 (×10): qty 1
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
## 2020-10-13 NOTE — ED Triage Notes (Addendum)
Pt arrives via ems from [facility] (assisted living)  for confusion. Pt has hx of dementia and stroke. [place] phone with patient and he seemed more confused than normal, called 911. Daughter states that the last time pt was like this, he had a uti. Pt has no complaints on arrival, was hypertensive for ems.

## 2020-10-13 NOTE — ED Notes (Addendum)
Spoke with patients daughter (who lives in [place]) and she verbalizes concern for patient being able to manage antibiotics and take appropriately. The facility he lives at does not help with medication management. No other family close by.  Dr. Derrill Kay updated - plan to admit patient.

## 2020-10-13 NOTE — ED Notes (Signed)
Pt tried but unable to void in urinal. Promofit applied and explained to pt.

## 2020-10-14 ENCOUNTER — Encounter: Payer: Self-pay | Admitting: Internal Medicine

## 2020-10-14 ENCOUNTER — Other Ambulatory Visit: Payer: Self-pay

## 2020-10-14 DIAGNOSIS — G629 Polyneuropathy, unspecified: Secondary | ICD-10-CM

## 2020-10-14 DIAGNOSIS — Z8673 Personal history of transient ischemic attack (TIA), and cerebral infarction without residual deficits: Secondary | ICD-10-CM

## 2020-10-14 DIAGNOSIS — R531 Weakness: Secondary | ICD-10-CM

## 2020-10-14 DIAGNOSIS — E785 Hyperlipidemia, unspecified: Secondary | ICD-10-CM

## 2020-10-14 DIAGNOSIS — I10 Essential (primary) hypertension: Secondary | ICD-10-CM

## 2020-10-14 DIAGNOSIS — N3000 Acute cystitis without hematuria: Principal | ICD-10-CM

## 2020-10-14 DIAGNOSIS — G9341 Metabolic encephalopathy: Secondary | ICD-10-CM | POA: Diagnosis not present

## 2020-10-14 LAB — CBC
HCT: 46.7 % (ref 39.0–52.0)
Hemoglobin: 15.5 g/dL (ref 13.0–17.0)
MCH: 29.9 pg (ref 26.0–34.0)
MCHC: 33.2 g/dL (ref 30.0–36.0)
MCV: 90.2 fL (ref 80.0–100.0)
Platelets: 142 10*3/uL — ABNORMAL LOW (ref 150–400)
RBC: 5.18 MIL/uL (ref 4.22–5.81)
RDW: 13.5 % (ref 11.5–15.5)
WBC: 5.4 10*3/uL (ref 4.0–10.5)
nRBC: 0 % (ref 0.0–0.2)

## 2020-10-14 LAB — BASIC METABOLIC PANEL
Anion gap: 9 (ref 5–15)
BUN: 6 mg/dL — ABNORMAL LOW (ref 8–23)
CO2: 23 mmol/L (ref 22–32)
Calcium: 8.5 mg/dL — ABNORMAL LOW (ref 8.9–10.3)
Chloride: 105 mmol/L (ref 98–111)
Creatinine, Ser: 0.7 mg/dL (ref 0.61–1.24)
GFR, Estimated: >60 mL/min (ref >60)
Glucose, Bld: 108 mg/dL — ABNORMAL HIGH (ref 70–99)
Potassium: 3.6 mmol/L (ref 3.5–5.1)
Sodium: 137 mmol/L (ref 135–145)

## 2020-10-14 MED ORDER — SODIUM CHLORIDE 0.9 % IV SOLN
1.0000 g | INTRAVENOUS | Status: DC
  Administered 2020-10-14 – 2020-10-17 (×4): 1 g via INTRAVENOUS
  Filled 2020-10-14: qty 10
  Filled 2020-10-14 (×3): qty 1

## 2020-10-14 MED ORDER — HYDRALAZINE HCL 20 MG/ML IJ SOLN
5.0000 mg | Freq: Four times a day (QID) | INTRAMUSCULAR | Status: DC | PRN
  Filled 2020-10-14: qty 0.25

## 2020-10-14 NOTE — Progress Notes (Signed)
Patient ID: Brandon [name], male   DOB: April 22, 1951, 70 y.o.   MRN: [id number] [facility] PROGRESS NOTE  Brandon [name] [id number]:[id number] DOB: 1951/02/25 DOA: 10/13/2020 PCP: Sherron Monday, MD  HPI/Subjective: Patient seen sitting in the chair.  Patient answered questions appropriately and asked me questions about myself.  I did speak with the patient's daughter and she stated that he called him 12 times already and does not feel that he is back to his baseline mental status at this point.  Admitted with acute metabolic encephalopathy and UTI.  Objective: Vitals:   10/14/20 0727 10/14/20 1132  BP: (!) 145/79 129/74  Pulse: 82 82  Resp: 17 18  Temp: 97.8 F (36.6 C) 97.7 F (36.5 C)  SpO2: 90% 96%    Intake/Output Summary (Last 24 hours) at 10/14/2020 1507 Last data filed at 10/14/2020 5885 Gross per 24 hour  Intake 550 ml  Output 900 ml  Net -350 ml   Filed Weights   10/13/20 1328  Weight: 90.7 kg    ROS: Review of Systems  Respiratory: Negative for shortness of breath.   Gastrointestinal: Negative for abdominal pain, nausea and vomiting.   Exam: Physical Exam HENT:     Head: Normocephalic.     Mouth/Throat:     Pharynx: No oropharyngeal exudate.  Eyes:     General: Lids are normal.     Conjunctiva/sclera: Conjunctivae normal.  Cardiovascular:     Rate and Rhythm: Normal rate and regular rhythm.     Heart sounds: Normal heart sounds, S1 normal and S2 normal.  Pulmonary:     Breath sounds: Normal breath sounds. No decreased breath sounds, wheezing, rhonchi or rales.  Abdominal:     Palpations: Abdomen is soft.     Tenderness: There is no abdominal tenderness.  Musculoskeletal:     Right lower leg: Swelling present.     Left lower leg: Swelling present.  Skin:    General: Skin is warm.     Findings: No rash.  Neurological:     Mental Status: He is alert.     Comments: For me answered all questions appropriately and was actually inquiring about me and  asking me questions.       Data Reviewed: Basic Metabolic Panel: Recent Labs  Lab 10/13/20 1346 10/14/20 0508  NA 137 137  K 3.7 3.6  CL 104 105  CO2 24 23  GLUCOSE 117* 108*  BUN 8 6*  CREATININE 0.96 0.70  CALCIUM 8.8* 8.5*   CBC: Recent Labs  Lab 10/13/20 1346 10/14/20 0508  WBC 4.4 5.4  NEUTROABS 2.8  --   HGB 14.5 15.5  HCT 45.0 46.7  MCV 90.4 90.2  PLT 142* 142*    Recent Results (from the past 240 hour(s))  Resp Panel by RT-PCR (Flu A&B, Covid) Nasopharyngeal Swab     Status: None   Collection Time: 10/13/20  1:46 PM   Specimen: Nasopharyngeal Swab; Nasopharyngeal(NP) swabs in vial transport medium  Result Value Ref Range Status   SARS Coronavirus 2 by RT PCR NEGATIVE NEGATIVE Final    Comment: (NOTE) SARS-CoV-2 target nucleic acids are NOT DETECTED.  The SARS-CoV-2 RNA is generally detectable in upper respiratory specimens during the acute phase of infection. The lowest concentration of SARS-CoV-2 viral copies this assay can detect is 138 copies/mL. A negative result does not preclude SARS-Cov-2 infection and should not be used as the sole basis for treatment or other patient management decisions. A negative result may occur  with  improper specimen collection/handling, submission of specimen other than nasopharyngeal swab, presence of viral mutation(s) within the areas targeted by this assay, and inadequate number of viral copies(<138 copies/mL). A negative result must be combined with clinical observations, patient history, and epidemiological information. The expected result is Negative.  Fact Sheet for Patients:  BloggerCourse.com  Fact Sheet for Healthcare Providers:  SeriousBroker.it  This test is no t yet approved or cleared by the [place] FDA and  has been authorized for detection and/or diagnosis of SARS-CoV-2 by FDA under an Emergency Use Authorization (EUA). This EUA will remain   in effect (meaning this test can be used) for the duration of the COVID-19 declaration under Section 564(b)(1) of the Act, 21 U.S.C.section 360bbb-3(b)(1), unless the authorization is terminated  or revoked sooner.       Influenza A by PCR NEGATIVE NEGATIVE Final   Influenza B by PCR NEGATIVE NEGATIVE Final    Comment: (NOTE) The Xpert Xpress SARS-CoV-2/FLU/RSV plus assay is intended as an aid in the diagnosis of influenza from Nasopharyngeal swab specimens and should not be used as a sole basis for treatment. Nasal washings and aspirates are unacceptable for Xpert Xpress SARS-CoV-2/FLU/RSV testing.  Fact Sheet for Patients: BloggerCourse.com  Fact Sheet for Healthcare Providers: SeriousBroker.it  This test is not yet approved or cleared by the [place] FDA and has been authorized for detection and/or diagnosis of SARS-CoV-2 by FDA under an Emergency Use Authorization (EUA). This EUA will remain in effect (meaning this test can be used) for the duration of the COVID-19 declaration under Section 564(b)(1) of the Act, 21 U.S.C. section 360bbb-3(b)(1), unless the authorization is terminated or revoked.  Performed at [facility], [address] [place], [place] [address]      Studies: CT HEAD WO CONTRAST  Result Date: 10/13/2020 CLINICAL DATA:  Mental status change EXAM: CT HEAD WITHOUT CONTRAST TECHNIQUE: Contiguous axial images were obtained from the base of the skull through the vertex without intravenous contrast. COMPARISON:  June 15, 2019 FINDINGS: Brain: No evidence of acute large vascular territory infarction, hemorrhage, hydrocephalus, extra-axial collection or mass lesion/mass effect. Mild age related global parenchymal volume loss. 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 Vascular: No hyperdense vessel. Atherosclerotic calcifications of the internal carotid arteries at skull base. Skull:  Normal. Negative for fracture or focal lesion. Sinuses/Orbits: The visualized paranasal sinuses and mastoid air cells are predominantly clear. Orbits are grossly unremarkable. Other: Dense cerumen in the right external auditory canal. IMPRESSION: 1. No acute intracranial findings. 2. 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 3. Dense cerumen in the right external auditory canal. Electronically Signed   By: Maudry Mayhew MD   On: 10/13/2020 22:23   DG Chest Portable 1 View  Result Date: 10/13/2020 CLINICAL DATA:  Weakness. EXAM: PORTABLE CHEST 1 VIEW COMPARISON:  June 15, 2019. FINDINGS: The heart size and mediastinal contours are within normal limits. Both lungs are clear. The visualized skeletal structures are unremarkable. IMPRESSION: No active disease. Electronically Signed   By: Lupita Raider M.D.   On: 10/13/2020 14:21    Scheduled Meds: . aspirin EC  81 mg Oral Daily  . atorvastatin  10 mg Oral Daily  . doxazosin  4 mg Oral Daily  . enoxaparin (LOVENOX) injection  40 mg Subcutaneous Q24H  . gabapentin  300 mg Oral TID  . hydrALAZINE  25 mg Oral Q8H  . oxybutynin  5 mg Oral QHS   Continuous Infusions: . cefTRIAXone (ROCEPHIN)  IV     Brief history.  Patient admitted 10/13/2020 with worsening mental status and confusion.  The patient lives by himself.  He was found to have a urinary tract infection.  Past medical history of stroke and right-sided weakness, hypertension hyperlipidemia.  Assessment/Plan:  1. Acute metabolic encephalopathy likely secondary to infection.  Continue to monitor closely.  Patient answering questions for me appropriately but patient's daughter states that he is not back to his baseline yet. 2. Acute cystitis without hematuria on Rocephin.  Follow-up urine culture. 3. History of nonhemorrhagic completed stroke with right sided weakness.  Physical therapy recommending rehab.  CT head showed chronic lacunar type left paramedian pontine  infarct. 4. Hyperlipidemia unspecified on atorvastatin 5. Neuropathy on gabapentin 6. Essential hypertension on hydralazine 7. Weakness.  Physical therapy recommending rehab    Code Status:     Code Status Orders  (From admission, onward)         Start     Ordered   10/13/20 2012  Full code  Continuous        10/13/20 2013        Code Status History    Date Active Date Inactive Code Status Order ID Comments User Context   06/15/2019 1412 06/20/2019 0051 Full Code [id number]  Leatha Gilding, MD ED   Advance Care Planning Activity     Family Communication: Spoke with the patient's daughter on the phone Disposition Plan: Status is: Inpatient  Dispo: The patient is from: Home              Anticipated d/c is to: Rehab, transitional care team looking into rehab options              Patient currently receiving IV antibiotics for acute cystitis.   Difficult to place patient.  Hopefully not  Antibiotics:  Rocephin  Time spent: 28 minutes  Mikah [employer]

## 2020-10-14 NOTE — Evaluation (Signed)
Physical Therapy Evaluation Patient Details Name: Brandon [name] MRN: [id number] DOB: April 06, 1951 Today's Date: 10/14/2020   History of Present Illness  The pt is a 70 y.o. 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  Patient was brought in by EMS after daughter had expressed concern with patient being altered mental status from his baseline.    Clinical Impression  Pt alert, oriented to self only. Denied pain. Pt spoke with pt and pt family to confirm PLOF; pt lives in an independent living facility with aides that come every day, twice a day to assist with ADLs (meals, dressing, bathing, etc). At baseline pt is able to transfer out of bed to his power wheelchair modI.   The patient demonstrated chronic RUE and RUE weakness due to history of CVA. Supine to sit modI with use of bed rails, pt stated he does not have bed rails at home however. Stand pivot to R and L attempted during session. ModA to attempt to transfer to the recliner to the R at pt request, but unable to safely complete at this time. With set up to the left, pt needed minA to assist with lifting from bed and steadying once upright. Pt repositioned in chair, all needs in reach. Overall the patient demonstrated deficits (see "PT Problem List") that impede the patient's functional abilities, safety, and mobility and would benefit from skilled PT intervention. Recommendation is SNF due to acute decline in functional mobility and current level of assistance needed.     Follow Up Recommendations SNF    Equipment Recommendations  None recommended by PT    Recommendations for Other Services       Precautions / Restrictions Precautions Precautions: Fall Restrictions Weight Bearing Restrictions: No      Mobility  Bed Mobility Overal bed mobility: Modified Independent             General bed mobility comments: able to use bed  rails to transfer from supine with HOB  elevated to sitting EOB    Transfers Overall transfer level: Needs assistance   Transfers: Stand Pivot Transfers   Stand pivot transfers: Min assist;Mod assist       General transfer comment: ModA for stand pivot to R, pt unable to complete though reported at home he can transfer either direction, minA for steadying and lift from bed to transfer to recliner at bedside to the left  Ambulation/Gait             General Gait Details: deferred, pt nonambulatory at baseline  Stairs            Wheelchair Mobility    Modified Rankin (Stroke Patients Only)       Balance Overall balance assessment: Needs assistance Sitting-balance support: Feet supported Sitting balance-Leahy Scale: Good     Standing balance support: Single extremity supported Standing balance-Leahy Scale: Poor Standing balance comment: reliant on UE support/PT support this session                             Pertinent Vitals/Pain Pain Assessment: No/denies pain    Home Living Family/patient expects to be discharged to:: Private residence Living Arrangements: Alone Available Help at Discharge: Family;Available PRN/intermittently;Personal care attendant Type of Home: Independent living facility       Home Layout: One level Home Equipment: Wheelchair - power;Grab bars - tub/shower;Grab bars - toilet Additional Comments: PT  spoke with pt daughter to confirm PLOF    Prior Function Level of Independence: Needs assistance   Gait / Transfers Assistance Needed: Per daughter pt is a stand pivot modI transfer to power chair  ADL's / Homemaking Assistance Needed: Per daughter the patient has aides that come every day in AM/PM to assist with ADLs. Family reports aides do have to perform most of the ADLs with some assistance from the patient        Hand Dominance        Extremity/Trunk Assessment   Upper Extremity Assessment Upper Extremity  Assessment: RUE deficits/detail;LUE deficits/detail RUE Deficits / Details: able to initiate R shoulder flexion, elbow extension/flexion, very limited ROM. LUE Deficits / Details: grossly 4+/5    Lower Extremity Assessment Lower Extremity Assessment: LLE deficits/detail;RLE deficits/detail RLE Deficits / Details: able to effortfully lift RLE against gravity with SLR, lag noted. unable to DF/PF ankle, minimal toe flexion noted. AAROM for heel slide. LLE Deficits / Details: grossly 4+/5    Cervical / Trunk Assessment Cervical / Trunk Assessment: Kyphotic  Communication   Communication: No difficulties  Cognition Arousal/Alertness: Awake/alert Behavior During Therapy: WFL for tasks assessed/performed Overall Cognitive Status: Difficult to assess                                 General Comments: Pt oriented to self only; family reported pt is most of the time oriented x3      General Comments      Exercises     Assessment/Plan    PT Assessment Patient needs continued PT services  PT Problem List Decreased strength;Decreased activity tolerance;Decreased balance;Decreased mobility       PT Treatment Interventions Balance training;DME instruction;Neuromuscular re-education;Functional mobility training;Patient/family education;Therapeutic activities;Wheelchair mobility training;Therapeutic exercise    PT Goals (Current goals can be found in the Care Plan section)  Acute Rehab PT Goals Patient Stated Goal: for pt to recieve enough help at home to be safe PT Goal Formulation: With family Time For Goal Achievement: 10/28/20 Potential to Achieve Goals: Good    Frequency Min 2X/week   Barriers to discharge        Co-evaluation               AM-PAC PT "6 Clicks" Mobility  Outcome Measure Help needed turning from your back to your side while in a flat bed without using bedrails?: None Help needed moving from lying on your back to sitting on the side of a  flat bed without using bedrails?: A Little Help needed moving to and from a bed to a chair (including a wheelchair)?: A Little Help needed standing up from a chair using your arms (e.g., wheelchair or bedside chair)?: A Little Help needed to walk in hospital room?: Total Help needed climbing 3-5 steps with a railing? : Total 6 Click Score: 15    End of Session Equipment Utilized During Treatment: Gait belt Activity Tolerance: Patient tolerated treatment well Patient left: in chair;with call bell/phone within reach;with chair alarm set Nurse Communication: Mobility status PT Visit Diagnosis: Other abnormalities of gait and mobility (R26.89);Muscle weakness (generalized) (M62.81);Difficulty in walking, not elsewhere classified (R26.2)    Time: 1000-1020 PT Time Calculation (min) (ACUTE ONLY): 20 min   Charges:   PT Evaluation $PT Eval Low Complexity: 1 Low PT Treatments $Therapeutic Activity: 8-22 mins       Olga Coaster PT, DPT 10:38 AM,10/14/20
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
## 2020-10-14 NOTE — [place] FL2 (Signed)
[place] MEDICAID FL2 LEVEL OF CARE SCREENING TOOL     IDENTIFICATION  Patient Name: Brandon [name] Birthdate: [birth year] Sex: male Admission Date (Current Location): 10/13/2020  [place] and [place] Number:  Chiropodist and Address:  [facility], [address] [place], [place] [address]      Provider Number: [id number]  Attending Physician Name and Address:  Alford Highland, MD  Relative Name and Phone Number:  Neta Mends (daughter) [phone]    Current Level of Care: Hospital Recommended Level of Care: Skilled Nursing Facility Prior Approval Number:    Date Approved/Denied:   PASRR Number: [id number]  Discharge Plan: SNF    Current Diagnoses: Patient Active Problem List   Diagnosis Date Noted  . Acute metabolic encephalopathy   . Hyperlipidemia   . Neuropathy   . Weakness   . UTI (urinary tract infection) 10/13/2020  . Essential hypertension   . Alzheimer's dementia with behavioral disturbance ([facility]) 06/15/2019  . Hx of completed stroke 10/2005  . Chest pain 10/2005    Orientation RESPIRATION BLADDER Height & Weight     Self  Normal Continent,External catheter Weight: 90.7 kg Height:  6' (182.9 cm)  BEHAVIORAL SYMPTOMS/MOOD NEUROLOGICAL BOWEL NUTRITION STATUS      Continent Diet (Heart Healthy)  AMBULATORY STATUS COMMUNICATION OF NEEDS Skin   Extensive Assist Verbally Normal                       Personal Care Assistance Level of Assistance  Bathing,Feeding,Dressing Bathing Assistance: Maximum assistance Feeding assistance: Limited assistance Dressing Assistance: Maximum assistance     Functional Limitations Info             SPECIAL CARE FACTORS FREQUENCY  PT (By licensed PT),OT (By licensed OT)     PT Frequency: 5 times per week OT Frequency: 5 times per week            Contractures Contractures Info: Not present    Additional Factors Info  Code Status,Allergies Code Status Info:  Full Allergies Info: NKA           Current Medications (10/14/2020):  This is the current hospital active medication list Current Facility-Administered Medications  Medication Dose Route Frequency Provider Last Rate Last Admin  . albuterol (PROVENTIL) (2.5 MG/3ML) 0.083% nebulizer solution 2.5 mg  2.5 mg Nebulization Q6H PRN Acheampong, Genice Rouge, MD      . aspirin EC tablet 81 mg  81 mg Oral Daily Acheampong, Genice Rouge, MD   81 mg at 10/14/20 0740  . atorvastatin (LIPITOR) tablet 10 mg  10 mg Oral Daily Acheampong, Genice Rouge, MD   10 mg at 10/14/20 0740  . cefTRIAXone (ROCEPHIN) 1 g in sodium chloride 0.9 % 100 mL IVPB  1 g Intravenous Q24H Hall, Carole N, DO      . doxazosin (CARDURA) tablet 4 mg  4 mg Oral Daily Acheampong, Genice Rouge, MD   4 mg at 10/14/20 0740  . enoxaparin (LOVENOX) injection 40 mg  40 mg Subcutaneous Q24H Acheampong, Genice Rouge, MD   40 mg at 10/13/20 2254  . gabapentin (NEURONTIN) capsule 300 mg  300 mg Oral TID Lilia Pro, MD   300 mg at 10/14/20 1524  . hydrALAZINE (APRESOLINE) injection 5 mg  5 mg Intravenous Q6H PRN Dow Adolph N, DO      . hydrALAZINE (APRESOLINE) tablet 25 mg  25 mg Oral Q8H Acheampong, Genice Rouge, MD   25 mg at  10/14/20 0528  . oxybutynin (DITROPAN-XL) 24 hr tablet 5 mg  5 mg Oral QHS Acheampong, Genice Rouge, MD   5 mg at 10/13/20 2254     Discharge Medications: Please see discharge summary for a list of discharge medications.  Relevant Imaging Results:  Relevant Lab Results:   Additional Information SS# [id number]  Allayne Butcher, RN

## 2020-10-15 DIAGNOSIS — F0390 Unspecified dementia without behavioral disturbance: Secondary | ICD-10-CM

## 2020-10-15 DIAGNOSIS — G9341 Metabolic encephalopathy: Secondary | ICD-10-CM | POA: Diagnosis not present

## 2020-10-15 MED ORDER — THIAMINE HCL 100 MG/ML IJ SOLN
100.0000 mg | Freq: Every day | INTRAMUSCULAR | Status: DC
  Administered 2020-10-15 – 2020-10-16 (×2): 100 mg via INTRAVENOUS
  Filled 2020-10-15 (×2): qty 2
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
## 2020-10-15 NOTE — TOC Progression Note (Signed)
Transition of Care [facility]) - Progression Note    Patient Details  Name: Brandon [name] MRN: [id number] Date of Birth: [birth year]  Transition of Care [facility]) CM/SW Contact  Allayne Butcher, RN Phone Number: 10/15/2020, 4:22 PM  Clinical Narrative:    RNCM reached out to patient's daughter with bed offers for [facility] and [facility].  Daughter would like to look them up and then get back to this RNCM with choice.     Expected Discharge Plan: Skilled Nursing Facility Barriers to Discharge: Continued Medical Work up  Expected Discharge Plan and Services Expected Discharge Plan: Skilled Nursing Facility   Discharge Planning Services: CM Consult Post Acute Care Choice: Skilled Nursing Facility Living arrangements for the past 2 months: Apartment                 DME Arranged: N/A DME Agency: NA                   Social Determinants of Health (SDOH) Interventions    Readmission Risk Interventions No flowsheet data found.

## 2020-10-15 NOTE — Progress Notes (Signed)
PROGRESS NOTE    Brandon [name]  EUM:[id number] DOB: 03-Feb-1951 DOA: 10/13/2020 PCP: Sherron Monday, MD   Brief Narrative: 70 year old with past medical history significant for Alzheimer's dementia, history of CVA with right upper extremity weakness and hypertension presented to the ED due to altered mental status.  Patient was noted to be more confused by family.  No new focal deficit.  Patient admitted with acute metabolic encephalopathy secondary to UTI.   Assessment & Plan:   Active Problems:   Hx of completed stroke   Essential hypertension   UTI (urinary tract infection)   Acute metabolic encephalopathy   Hyperlipidemia   Neuropathy   Weakness   1-Acute metabolic encephalopathy:  likely secondary to infection Patient  continues to be confused Continue with treatment of UTI Check B12  2-UTI: Urine culture growing E. coli.  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 Continue with PT  4-Hyperlipidemia: Continue with a statin 5-Neuropathy: Continue with gabapentin 6-Hypertension: Continue with hydralazine    Estimated body mass index is 27.12 kg/m as calculated from the following:   Height as of this encounter: 6' (1.829 m).   Weight as of this encounter: 90.7 kg.   DVT prophylaxis: lovenox Code Status: Full code Family Communication: will update daughter Disposition Plan:  Status is: Inpatient  Remains inpatient appropriate because:IV treatments appropriate due to intensity of illness or inability to take PO   Dispo: The patient is from: Home              Anticipated d/c is to: SNF              Patient currently is not medically stable to d/c.   Difficult to place patient No        Consultants:   None  Procedures:     Antimicrobials:  Ceftriaxone  Subjective: He is alert, oriented to person, he  didn't know he was at [facility].  He denies pain. He is pleasantly confuse.   Objective: Vitals:   10/15/20 0824 10/15/20 1208 10/15/20 1412 10/15/20 1608  BP: (!) 143/82 107/71 112/70 129/76  Pulse: 75 70 73 70  Resp: 18 18  18   Temp: 98.1 F (36.7 C) 97.9 F (36.6 C)  98.1 F (36.7 C)  TempSrc:      SpO2:  100% 100% 100%  Weight:      Height:        Intake/Output Summary (Last 24 hours) at 10/15/2020 1646 Last data filed at 10/15/2020 1400 Gross per 24 hour  Intake 480 ml  Output 700 ml  Net -220 ml   Filed Weights   10/13/20 1328  Weight: 90.7 kg    Examination:  General exam: Appears calm and comfortable  Respiratory system: Clear to auscultation. Respiratory effort normal. Cardiovascular system: S1 & S2 heard, RRR. No JVD, murmurs, rubs, gallops or clicks. No pedal edema. Gastrointestinal system: Abdomen is nondistended, soft and nontender. No organomegaly or masses felt. Normal bowel sounds heard. Central nervous system: Alert and oriented time 1.  Extremities: Symmetric 5 x 5 power.   Data Reviewed: I have personally reviewed following labs and imaging studies  CBC: Recent Labs  Lab 10/13/20 1346 10/14/20 0508  WBC 4.4 5.4  NEUTROABS 2.8  --   HGB 14.5 15.5  HCT 45.0 46.7  MCV 90.4 90.2  PLT 142* 142*   Basic Metabolic Panel: Recent Labs  Lab 10/13/20 1346 10/14/20 0508  NA 137 137  K 3.7 3.6  CL 104 105  CO2 24 23  GLUCOSE 117* 108*  BUN 8 6*  CREATININE 0.96 0.70  CALCIUM 8.8* 8.5*   GFR: Estimated Creatinine Clearance: 93 mL/min (by C-G formula based on SCr of 0.7 mg/dL). Liver Function Tests: No results for input(s): AST, ALT, ALKPHOS, BILITOT, PROT, ALBUMIN in the last 168 hours. No results for input(s): LIPASE, AMYLASE in the last 168 hours. No results for input(s): AMMONIA in the last 168 hours. Coagulation Profile: No results for input(s): INR, PROTIME in the last 168 hours. Cardiac Enzymes: No results for input(s):  CKTOTAL, CKMB, CKMBINDEX, TROPONINI in the last 168 hours. BNP (last 3 results) No results for input(s): PROBNP in the last 8760 hours. HbA1C: No results for input(s): HGBA1C in the last 72 hours. CBG: No results for input(s): GLUCAP in the last 168 hours. Lipid Profile: No results for input(s): CHOL, HDL, LDLCALC, TRIG, CHOLHDL, LDLDIRECT in the last 72 hours. Thyroid Function Tests: Recent Labs    10/13/20 1346  TSH 2.103   Anemia Panel: No results for input(s): VITAMINB12, FOLATE, FERRITIN, TIBC, IRON, RETICCTPCT in the last 72 hours. Sepsis Labs: No results for input(s): PROCALCITON, LATICACIDVEN in the last 168 hours.  Recent Results (from the past 240 hour(s))  Resp Panel by RT-PCR (Flu A&B, Covid) Nasopharyngeal Swab     Status: None   Collection Time: 10/13/20  1:46 PM   Specimen: Nasopharyngeal Swab; Nasopharyngeal(NP) swabs in vial transport medium  Result Value Ref Range Status   SARS Coronavirus 2 by RT PCR NEGATIVE NEGATIVE Final    Comment: (NOTE) SARS-CoV-2 target nucleic acids are NOT DETECTED.  The SARS-CoV-2 RNA is generally detectable in upper respiratory specimens during the acute phase of infection. The lowest concentration of SARS-CoV-2 viral copies this assay can detect is 138 copies/mL. A negative result does not preclude SARS-Cov-2 infection and should not be used as the sole basis for treatment or other patient management decisions. A negative result may occur with  improper specimen collection/handling, submission of specimen other than nasopharyngeal swab, presence of viral mutation(s) within the areas targeted by this assay, and inadequate number of viral copies(<138 copies/mL). A negative result must be combined with clinical observations, patient history, and epidemiological information. The expected result is Negative.  Fact Sheet for Patients:  BloggerCourse.com  Fact Sheet for Healthcare Providers:   SeriousBroker.it  This test is no t yet approved or cleared by the [place] FDA and  has been authorized for detection and/or diagnosis of SARS-CoV-2 by FDA under an Emergency Use Authorization (EUA). This EUA will remain  in effect (meaning this test can be used) for the duration of the COVID-19 declaration under Section 564(b)(1) of the Act, 21 U.S.C.section 360bbb-3(b)(1), unless the authorization is terminated  or revoked sooner.       Influenza A by PCR NEGATIVE NEGATIVE Final   Influenza B by PCR NEGATIVE NEGATIVE Final    Comment: (NOTE) The Xpert Xpress SARS-CoV-2/FLU/RSV plus assay is intended as an aid in the diagnosis of influenza from Nasopharyngeal swab specimens and should not be used as a sole basis for treatment. Nasal washings and aspirates are unacceptable for Xpert Xpress SARS-CoV-2/FLU/RSV testing.  Fact Sheet for Patients: BloggerCourse.com  Fact Sheet for Healthcare Providers: SeriousBroker.it  This test is not yet approved or cleared by the [place] FDA and has been authorized for detection and/or diagnosis of SARS-CoV-2 by FDA under an Emergency  Use Authorization (EUA). This EUA will remain in effect (meaning this test can be used) for the duration of the COVID-19 declaration under Section 564(b)(1) of the Act, 21 U.S.C. section 360bbb-3(b)(1), unless the authorization is terminated or revoked.  Performed at [facility], [address] [place], [place] [address]   Urine culture     Status: Abnormal (Preliminary result)   Collection Time: 10/13/20  1:46 PM   Specimen: Urine, Random  Result Value Ref Range Status   Specimen Description   Final    URINE, RANDOM Performed at [facility], [address], [place], [place] [address]    Special Requests   Final    NONE Performed at [facility], [address] [place], [place]  [address]    Culture (A)  Final    >=100,000 COLONIES/mL ESCHERICHIA COLI SUSCEPTIBILITIES TO FOLLOW Performed at [facility], [address], [place], [place] [address]    Report Status PENDING  Incomplete         Radiology Studies: CT HEAD WO CONTRAST  Result Date: 10/13/2020 CLINICAL DATA:  Mental status change EXAM: CT HEAD WITHOUT CONTRAST TECHNIQUE: Contiguous axial images were obtained from the base of the skull through the vertex without intravenous contrast. COMPARISON:  June 15, 2019 FINDINGS: Brain: No evidence of acute large vascular territory infarction, hemorrhage, hydrocephalus, extra-axial collection or mass lesion/mass effect. Mild age related global parenchymal volume loss. 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 Vascular: No hyperdense vessel. Atherosclerotic calcifications of the internal carotid arteries at skull base. Skull: Normal. Negative for fracture or focal lesion. Sinuses/Orbits: The visualized paranasal sinuses and mastoid air cells are predominantly clear. Orbits are grossly unremarkable. Other: Dense cerumen in the right external auditory canal. IMPRESSION: 1. No acute intracranial findings. 2. 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 3. Dense cerumen in the right external auditory canal. Electronically Signed   By: Maudry Mayhew MD   On: 10/13/2020 22:23        Scheduled Meds: . aspirin EC  81 mg Oral Daily  . atorvastatin  10 mg Oral Daily  . doxazosin  4 mg Oral Daily  . enoxaparin (LOVENOX) injection  40 mg Subcutaneous Q24H  . gabapentin  300 mg Oral TID  . hydrALAZINE  25 mg Oral Q8H  . oxybutynin  5 mg Oral QHS   Continuous Infusions: . cefTRIAXone (ROCEPHIN)  IV 1 g (10/14/20 2203)     LOS: 2 days    Time spent: 35 minutes    Ethylene Reznick A Corita Allinson, MD [facility]   If 7PM-7AM, please contact night-coverage [email or web address]  10/15/2020, 4:46 PM

## 2020-10-16 DIAGNOSIS — G9341 Metabolic encephalopathy: Secondary | ICD-10-CM | POA: Diagnosis not present

## 2020-10-16 LAB — URINE CULTURE: Culture: >=100000 — AB

## 2020-10-16 LAB — RESP PANEL BY RT-PCR (FLU A&B, COVID) ARPGX2
Influenza A by PCR: NEGATIVE
Influenza B by PCR: NEGATIVE
SARS Coronavirus 2 by RT PCR: NEGATIVE

## 2020-10-16 LAB — VITAMIN B12: Vitamin B-12: 120 pg/mL — ABNORMAL LOW (ref 180–914)

## 2020-10-16 MED ORDER — THIAMINE HCL 100 MG PO TABS
100.0000 mg | ORAL_TABLET | Freq: Every day | ORAL | Status: DC
  Administered 2020-10-17 – 2020-10-22 (×6): 100 mg via ORAL
  Filled 2020-10-16 (×6): qty 1

## 2020-10-16 MED ORDER — VITAMIN B-12 1000 MCG PO TABS: 1000.0000 ug | ORAL_TABLET | Freq: Every day | ORAL | 0 refills | Status: DC

## 2020-10-16 MED ORDER — THIAMINE HCL 100 MG PO TABS: 100.0000 mg | ORAL_TABLET | Freq: Every day | ORAL | 0 refills | Status: DC

## 2020-10-16 MED ORDER — CYANOCOBALAMIN 1000 MCG/ML IJ SOLN
1000.0000 ug | Freq: Once | INTRAMUSCULAR | Status: AC
  Administered 2020-10-16: 1000 ug via INTRAMUSCULAR
  Filled 2020-10-16: qty 1

## 2020-10-16 MED ORDER — CEPHALEXIN 500 MG PO CAPS: 500.0000 mg | ORAL_CAPSULE | Freq: Three times a day (TID) | ORAL | 0 refills | Status: DC

## 2020-10-16 NOTE — Discharge Summary (Signed)
Physician Discharge Summary  ABDI HUSAK NWG:[id number] DOB: 1951-04-30 DOA: 10/13/2020  PCP: Sherron Monday, MD  Admit date: 10/13/2020 Discharge date: 10/16/2020  Admitted From: Home  Disposition:  SNF  Recommendations for Outpatient Follow-up:  1. Follow up with PCP in 1-2 weeks 2. Please obtain BMP/CBC in one week 3. Needs B 12 labs in 4 weeks.  4. Follow up Thiamine results.   Discharge Condition: Stable.  CODE STATUS: Full Code.  Diet recommendation: Heart Healthy   Brief/Interim Summary: 70 year old with past medical history significant for Alzheimer's dementia, history of CVA with right upper extremity weakness and hypertension presented to the ED due to altered mental status.  Patient was noted to be more confused by family.  No new focal deficit.  Patient admitted with acute metabolic encephalopathy secondary to UTI.   1-Acute metabolic encephalopathy:  likely secondary to infection Patient  continues to be confused Continue with treatment of UTI B 12 low, will supplement.  Stable, improving.   2-UTI: Urine culture growing E. coli.  Follow sensitivity, continue with IV ceftriaxone Received 3 days ceftriaxone while in the hospital. Discharge on Keflex for 2 days.   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    Discharge Diagnoses:  Active Problems:   Hx of completed stroke   Essential hypertension   UTI (urinary tract infection)   Acute metabolic encephalopathy   Hyperlipidemia   Neuropathy   Weakness   Chronic dementia without behavioral disturbance [facility])    Discharge Instructions  Discharge  Instructions    Diet - low sodium heart healthy   Complete by: As directed    Increase activity slowly   Complete by: As directed      Allergies as of 10/16/2020   No Known Allergies     Medication List    STOP taking these medications   celecoxib 200 MG capsule Commonly known as: CELEBREX   zolpidem 5 MG tablet Commonly known as: AMBIEN     TAKE these medications   aspirin 81 MG tablet Take 81 mg by mouth daily.   atorvastatin 10 MG tablet Commonly known as: LIPITOR Take 10 mg by mouth daily.   cephALEXin 500 MG capsule Commonly known as: KEFLEX Take 1 capsule (500 mg total) by mouth 3 (three) times daily for 2 days.   donepezil 10 MG tablet Commonly known as: ARICEPT Take 10 mg by mouth every morning.   doxazosin 4 MG tablet Commonly known as: CARDURA Take 4 mg by mouth daily.   gabapentin 300 MG capsule Commonly known as: NEURONTIN Take 1 capsule (300 mg total) by mouth 3 (three) times daily.   hydrALAZINE 10 MG tablet Commonly known as: APRESOLINE Take 1 tablet (10 mg total) by mouth every 8 (eight) hours.   oxybutynin 5 MG 24 hr tablet Commonly known as: DITROPAN-XL Take 5 mg by mouth at bedtime.   thiamine 100 MG tablet Take 1 tablet (100 mg total) by mouth daily.   vitamin B-12 1000 MCG tablet Commonly known as: CYANOCOBALAMIN Take 1 tablet (1,000 mcg total) by mouth daily.       Follow-up Information    Sherron Monday, MD In 2 days.   Specialty: Internal Medicine Contact information: [address]  [place] [id number] [phone]              No Known AllergiLucases  Consultations: None  Procedures/Studies: CT HEAD WO CONTRAST  Result Date: 10/13/2020 CLINICAL DATA:  Mental status change EXAM: CT HEAD WITHOUT CONTRAST TECHNIQUE: Contiguous axial images were obtained from the base of the skull through the vertex without intravenous contrast. COMPARISON:  June 15, 2019 FINDINGS: Brain: No evidence of acute large vascular  territory infarction, hemorrhage, hydrocephalus, extra-axial collection or mass lesion/mass effect. Mild age related global parenchymal volume loss. 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 Vascular: No hyperdense vessel. Atherosclerotic calcifications of the internal carotid arteries at skull base. Skull: Normal. Negative for fracture or focal lesion. Sinuses/Orbits: The visualized paranasal sinuses and mastoid air cells are predominantly clear. Orbits are grossly unremarkable. Other: Dense cerumen in the right external auditory canal. IMPRESSION: 1. No acute intracranial findings. 2. 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 3. Dense cerumen in the right external auditory canal. Electronically Signed   By: Maudry MayhewJeffrey  Waltz MD   On: 10/13/2020 22:23   DG Chest Portable 1 View  Result Date: 10/13/2020 CLINICAL DATA:  Weakness. EXAM: PORTABLE CHEST 1 VIEW COMPARISON:  June 15, 2019. FINDINGS: The heart size and mediastinal contours are within normal limits. Both lungs are clear. The visualized skeletal structures are unremarkable. IMPRESSION: No active disease. Electronically Signed   By: Lupita RaiderJames  Green Jr M.D.   On: 10/13/2020 14:21      Subjective: He is alert, he was oriented times 2.  He agrees to go to rehab.   Discharge Exam: Vitals:   10/16/20 0344 10/16/20 0740  BP: 123/80 (!) 146/72  Pulse: 70 72  Resp: 16 18  Temp: 98 F (36.7 C) 98 F (36.7 C)  SpO2: 100% 95%     General: Pt is alert, awake, not in acute distress Cardiovascular: RRR, S1/S2 +, no rubs, no gallops Respiratory: CTA bilaterally, no wheezing, no rhonchi Abdominal: Soft, NT, ND, bowel sounds + Extremities: no edema, no cyanosis    The results of significant diagnostics from this hospitalization (including imaging, microbiology, ancillary and laboratory) are listed below for reference.     Microbiology: Recent Results (from the past 240 hour(s))  Resp Panel by RT-PCR (Flu A&B, Covid)  Nasopharyngeal Swab     Status: None   Collection Time: 10/13/20  1:46 PM   Specimen: Nasopharyngeal Swab; Nasopharyngeal(NP) swabs in vial transport medium  Result Value Ref Range Status   SARS Coronavirus 2 by RT PCR NEGATIVE NEGATIVE Final    Comment: (NOTE) SARS-CoV-2 target nucleic acids are NOT DETECTED.  The SARS-CoV-2 RNA is generally detectable in upper respiratory specimens during the acute phase of infection. The lowest concentration of SARS-CoV-2 viral copies this assay can detect is 138 copies/mL. A negative result does not preclude SARS-Cov-2 infection and should not be used as the sole basis for treatment or other patient management decisions. A negative result may occur with  improper specimen collection/handling, submission of specimen other than nasopharyngeal swab, presence of viral mutation(s) within the areas targeted by this assay, and inadequate number of viral copies(<138 copies/mL). A negative result must be combined with clinical observations, patient history, and epidemiological information. The expected result is Negative.  Fact Sheet for Patients:  BloggerCourse.comhttps://www.fda.gov/media/152166/download  Fact Sheet for Healthcare Providers:  SeriousBroker.ithttps://www.fda.gov/media/152162/download  This test is no t yet approved or cleared by the [place] FDA and  has been authorized for detection and/or diagnosis of SARS-CoV-2 by FDA  under an Emergency Use Authorization (EUA). This EUA will remain  in effect (meaning this test can be used) for the duration of the COVID-19 declaration under Section 564(b)(1) of the Act, 21 U.S.C.section 360bbb-3(b)(1), unless the authorization is terminated  or revoked sooner.       Influenza A by PCR NEGATIVE NEGATIVE Final   Influenza B by PCR NEGATIVE NEGATIVE Final    Comment: (NOTE) The Xpert Xpress SARS-CoV-2/FLU/RSV plus assay is intended as an aid in the diagnosis of influenza from Nasopharyngeal swab specimens and should not be  used as a sole basis for treatment. Nasal washings and aspirates are unacceptable for Xpert Xpress SARS-CoV-2/FLU/RSV testing.  Fact Sheet for Patients: BloggerCourse.com  Fact Sheet for Healthcare Providers: SeriousBroker.it  This test is not yet approved or cleared by the [place] FDA and has been authorized for detection and/or diagnosis of SARS-CoV-2 by FDA under an Emergency Use Authorization (EUA). This EUA will remain in effect (meaning this test can be used) for the duration of the COVID-19 declaration under Section 564(b)(1) of the Act, 21 U.S.C. section 360bbb-3(b)(1), unless the authorization is terminated or revoked.  Performed at [facility], [address], [place], [place] [address]   Urine culture     Status: Abnormal   Collection Time: 10/13/20  1:46 PM   Specimen: Urine, Random  Result Value Ref Range Status   Specimen Description   Final    URINE, RANDOM Performed at [facility], [address] [place], [place] [address]    Special Requests   Final    NONE Performed at [facility], [address] [place], [place] [address]    Culture >=100,000 COLONIES/mL ESCHERICHIA COLI (A)  Final   Report Status 10/16/2020 FINAL  Final   Organism ID, Bacteria ESCHERICHIA COLI (A)  Final      Susceptibility   Escherichia coli - MIC*    AMPICILLIN >=32 RESISTANT Resistant     CEFAZOLIN <=4 SENSITIVE Sensitive     CEFEPIME <=0.12 SENSITIVE Sensitive     CEFTRIAXONE <=0.25 SENSITIVE Sensitive     CIPROFLOXACIN >=4 RESISTANT Resistant     GENTAMICIN <=1 SENSITIVE Sensitive     IMIPENEM <=0.25 SENSITIVE Sensitive     NITROFURANTOIN <=16 SENSITIVE Sensitive     TRIMETH/SULFA <=20 SENSITIVE Sensitive     AMPICILLIN/SULBACTAM 16 INTERMEDIATE Intermediate     PIP/TAZO <=4 SENSITIVE Sensitive     * >=100,000 COLONIES/mL ESCHERICHIA COLI     Labs: BNP (last 3 results) No results for  input(s): BNP in the last 8760 hours. Basic Metabolic Panel: Recent Labs  Lab 10/13/20 1346 10/14/20 0508  NA 137 137  K 3.7 3.6  CL 104 105  CO2 24 23  GLUCOSE 117* 108*  BUN 8 6*  CREATININE 0.96 0.70  CALCIUM 8.8* 8.5*   Liver Function Tests: No results for input(s): AST, ALT, ALKPHOS, BILITOT, PROT, ALBUMIN in the last 168 hours. No results for input(s): LIPASE, AMYLASE in the last 168 hours. No results for input(s): AMMONIA in the last 168 hours. CBC: Recent Labs  Lab 10/13/20 1346 10/14/20 0508  WBC 4.4 5.4  NEUTROABS 2.8  --   HGB 14.5 15.5  HCT 45.0 46.7  MCV 90.4 90.2  PLT 142* 142*   Cardiac Enzymes: No results for input(s): CKTOTAL, CKMB, CKMBINDEX, TROPONINI in the last 168 hours. BNP: Invalid input(s): POCBNP CBG: No results for input(s): GLUCAP in the last 168 hours. D-Dimer No results for input(s): DDIMER in  the last 72 hours. Hgb A1c No results for input(s): HGBA1C in the last 72 hours. Lipid Profile No results for input(s): CHOL, HDL, LDLCALC, TRIG, CHOLHDL, LDLDIRECT in the last 72 hours. Thyroid function studies Recent Labs    10/13/20 1346  TSH 2.103   Anemia work up Recent Labs    10/16/20 0424  VITAMINB12 120*   Urinalysis    Component Value Date/Time   COLORURINE YELLOW (A) 10/13/2020 1346   APPEARANCEUR CLOUDY (A) 10/13/2020 1346   LABSPEC 1.011 10/13/2020 1346   PHURINE 8.0 10/13/2020 1346   GLUCOSEU NEGATIVE 10/13/2020 1346   HGBUR NEGATIVE 10/13/2020 1346   BILIRUBINUR NEGATIVE 10/13/2020 1346   KETONESUR NEGATIVE 10/13/2020 1346   PROTEINUR NEGATIVE 10/13/2020 1346   NITRITE NEGATIVE 10/13/2020 1346   LEUKOCYTESUR LARGE (A) 10/13/2020 1346   Sepsis Labs Invalid input(s): PROCALCITONIN,  WBC,  LACTICIDVEN Microbiology Recent Results (from the past 240 hour(s))  Resp Panel by RT-PCR (Flu A&B, Covid) Nasopharyngeal Swab     Status: None   Collection Time: 10/13/20  1:46 PM   Specimen: Nasopharyngeal Swab;  Nasopharyngeal(NP) swabs in vial transport medium  Result Value Ref Range Status   SARS Coronavirus 2 by RT PCR NEGATIVE NEGATIVE Final    Comment: (NOTE) SARS-CoV-2 target nucleic acids are NOT DETECTED.  The SARS-CoV-2 RNA is generally detectable in upper respiratory specimens during the acute phase of infection. The lowest concentration of SARS-CoV-2 viral copies this assay can detect is 138 copies/mL. A negative result does not preclude SARS-Cov-2 infection and should not be used as the sole basis for treatment or other patient management decisions. A negative result may occur with  improper specimen collection/handling, submission of specimen other than nasopharyngeal swab, presence of viral mutation(s) within the areas targeted by this assay, and inadequate number of viral copies(<138 copies/mL). A negative result must be combined with clinical observations, patient history, and epidemiological information. The expected result is Negative.  Fact Sheet for Patients:  BloggerCourse.com  Fact Sheet for Healthcare Providers:  SeriousBroker.it  This test is no t yet approved or cleared by the [place] FDA and  has been authorized for detection and/or diagnosis of SARS-CoV-2 by FDA under an Emergency Use Authorization (EUA). This EUA will remain  in effect (meaning this test can be used) for the duration of the COVID-19 declaration under Section 564(b)(1) of the Act, 21 U.S.C.section 360bbb-3(b)(1), unless the authorization is terminated  or revoked sooner.       Influenza A by PCR NEGATIVE NEGATIVE Final   Influenza B by PCR NEGATIVE NEGATIVE Final    Comment: (NOTE) The Xpert Xpress SARS-CoV-2/FLU/RSV plus assay is intended as an aid in the diagnosis of influenza from Nasopharyngeal swab specimens and should not be used as a sole basis for treatment. Nasal washings and aspirates are unacceptable for Xpert Xpress  SARS-CoV-2/FLU/RSV testing.  Fact Sheet for Patients: BloggerCourse.com  Fact Sheet for Healthcare Providers: SeriousBroker.it  This test is not yet approved or cleared by the [place] FDA and has been authorized for detection and/or diagnosis of SARS-CoV-2 by FDA under an Emergency Use Authorization (EUA). This EUA will remain in effect (meaning this test can be used) for the duration of the COVID-19 declaration under Section 564(b)(1) of the Act, 21 U.S.C. section 360bbb-3(b)(1), unless the authorization is terminated or revoked.  Performed at [facility], [address] [place], [place] [address]   Urine culture     Status: Abnormal   Collection Time: 10/13/20  1:46  PM   Specimen: Urine, Random  Result Value Ref Range Status   Specimen Description   Final    URINE, RANDOM Performed at [facility], [address] [place], [place] [address]    Special Requests   Final    NONE Performed at [facility], [address] [place], [place] [address]    Culture >=100,000 COLONIES/mL ESCHERICHIA COLI (A)  Final   Report Status 10/16/2020 FINAL  Final   Organism ID, Bacteria ESCHERICHIA COLI (A)  Final      Susceptibility   Escherichia coli - MIC*    AMPICILLIN >=32 RESISTANT Resistant     CEFAZOLIN <=4 SENSITIVE Sensitive     CEFEPIME <=0.12 SENSITIVE Sensitive     CEFTRIAXONE <=0.25 SENSITIVE Sensitive     CIPROFLOXACIN >=4 RESISTANT Resistant     GENTAMICIN <=1 SENSITIVE Sensitive     IMIPENEM <=0.25 SENSITIVE Sensitive     NITROFURANTOIN <=16 SENSITIVE Sensitive     TRIMETH/SULFA <=20 SENSITIVE Sensitive     AMPICILLIN/SULBACTAM 16 INTERMEDIATE Intermediate     PIP/TAZO <=4 SENSITIVE Sensitive     * >=100,000 COLONIES/mL ESCHERICHIA COLI     Time coordinating discharge: 40 minutes  SIGNED:   Alba Cory, MD  [facility]

## 2020-10-16 NOTE — Progress Notes (Signed)
Patient is alert and pleasantly confused. Patient is finally asleep and resting after multiple trips to the bedside alarm. Discussed with charge; will get patients vitals at the next vital check of 0400. Will continue to monitor.

## 2020-10-16 NOTE — TOC Progression Note (Signed)
Transition of Care [facility]) - Progression Note    Patient Details  Name: MELIK BLANCETT MRN: [id number] Date of Birth: 25-Nov-1950  Transition of Care [facility] CM/SW Contact  Allayne Butcher, RN Phone Number: 10/16/2020, 11:13 AM  Clinical Narrative:     [facility].  [facility] will start insurance authorization.  Patient has been fully vaccinated per daughter.   Expected Discharge Plan: Skilled Nursing Facility Barriers to Discharge: Continued Medical Work up  Expected Discharge Plan and Services Expected Discharge Plan: Skilled Nursing Facility   Discharge Planning Services: CM Consult Post Acute Care Choice: Skilled Nursing Facility Living arrangements for the past 2 months: Apartment Expected Discharge Date: 10/16/20               DME Arranged: N/A DME Agency: NA                   Social Determinants of Health (SDOH) Interventions    Readmission Risk Interventions No flowsheet data found.

## 2020-10-17 DIAGNOSIS — F0390 Unspecified dementia without behavioral disturbance: Secondary | ICD-10-CM | POA: Diagnosis not present

## 2020-10-17 MED ORDER — CYANOCOBALAMIN 1000 MCG/ML IJ SOLN
1000.0000 ug | Freq: Once | INTRAMUSCULAR | Status: AC
  Administered 2020-10-17: 08:00:00 1000 ug via INTRAMUSCULAR
  Filled 2020-10-17: qty 1

## 2020-10-17 NOTE — Discharge Summary (Signed)
Physician Discharge Summary  Brandon [name] OVZ:[id number] DOB: 1951-01-16 DOA: 10/13/2020  PCP: Sherron Monday, MD  Admit date: 10/13/2020 Discharge date: 10/17/2020  Admitted From: Home  Disposition:  SNF  Recommendations for Outpatient Follow-up:  1. Follow up with PCP in 1-2 weeks 2. Please obtain BMP/CBC in one week 3. Needs B 12 labs in 4 weeks.  4. Follow up Thiamine results.   Discharge Condition: Stable.  CODE STATUS: Full Code.  Diet recommendation: Heart Healthy   Brief/Interim Summary: 70 year old with past medical history significant for Alzheimer's dementia, history of CVA with right upper extremity weakness and hypertension presented to the ED due to altered mental status.  Patient was noted to be more confused by family.  No new focal deficit.  Patient admitted with acute metabolic encephalopathy secondary to UTI.   1-Acute metabolic encephalopathy:  likely secondary to infection Patient  continues to be confused Continue with treatment of UTI B 12 low, will supplement.  Stable, improving.   2-UTI: Urine culture growing E. coli.  Follow sensitivity, continue with IV ceftriaxone Received 3 days ceftriaxone while in the hospital. Discharge on Keflex for 2 days.   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   Patient stable, awaiting placement.    Discharge Diagnoses:  Active Problems:   Hx of completed stroke   Essential hypertension   UTI (urinary tract infection)   Acute metabolic encephalopathy   Hyperlipidemia   Neuropathy   Weakness   Chronic dementia without behavioral disturbance  [facility])    Discharge Instructions  Discharge Instructions    Diet - low sodium heart healthy   Complete by: As directed    Increase activity slowly   Complete by: As directed      Allergies as of 10/17/2020   No Known Allergies     Medication List    STOP taking these medications   celecoxib 200 MG capsule Commonly known as: CELEBREX   zolpidem 5 MG tablet Commonly known as: AMBIEN     TAKE these medications   aspirin 81 MG tablet Take 81 mg by mouth daily.   atorvastatin 10 MG tablet Commonly known as: LIPITOR Take 10 mg by mouth daily.   cephALEXin 500 MG capsule Commonly known as: KEFLEX Take 1 capsule (500 mg total) by mouth 3 (three) times daily for 2 days.   donepezil 10 MG tablet Commonly known as: ARICEPT Take 10 mg by mouth every morning.   doxazosin 4 MG tablet Commonly known as: CARDURA Take 4 mg by mouth daily.   gabapentin 300 MG capsule Commonly known as: NEURONTIN Take 1 capsule (300 mg total) by mouth 3 (three) times daily.   hydrALAZINE 10 MG tablet Commonly known as: APRESOLINE Take 1 tablet (10 mg total) by mouth every 8 (eight) hours.   oxybutynin 5 MG 24 hr tablet Commonly known as: DITROPAN-XL Take 5 mg by mouth at bedtime.   thiamine 100 MG tablet Take 1 tablet (100 mg total) by mouth daily.   vitamin B-12 1000 MCG tablet Commonly known as: CYANOCOBALAMIN Take 1 tablet (1,000 mcg total) by mouth daily.       Contact information for follow-up providers    Sherron Monday, MD In 2 days.  Specialty: Internal Medicine Contact information: [address] [place] [address] [phone]        Please follow up.   Why: Facillity to make follow up appt           Contact information for after-discharge care    Destination    [facility] .   Service: Skilled Nursing Contact information: [address] [place] [address] [phone]                 No Known  Allergies  Consultations: None  Procedures/Studies: CT HEAD WO CONTRAST  Result Date: 10/13/2020 CLINICAL DATA:  Mental status change EXAM: CT HEAD WITHOUT CONTRAST TECHNIQUE: Contiguous axial images were obtained from the base of the skull through the vertex without intravenous contrast. COMPARISON:  June 15, 2019 FINDINGS: Brain: No evidence of acute large vascular territory infarction, hemorrhage, hydrocephalus, extra-axial collection or mass lesion/mass effect. Mild age related global parenchymal volume loss. 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 Vascular: No hyperdense vessel. Atherosclerotic calcifications of the internal carotid arteries at skull base. Skull: Normal. Negative for fracture or focal lesion. Sinuses/Orbits: The visualized paranasal sinuses and mastoid air cells are predominantly clear. Orbits are grossly unremarkable. Other: Dense cerumen in the right external auditory canal. IMPRESSION: 1. No acute intracranial findings. 2. 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 3. Dense cerumen in the right external auditory canal. Electronically Signed   By: Maudry Mayhew MD   On: 10/13/2020 22:23   DG Chest Portable 1 View  Result Date: 10/13/2020 CLINICAL DATA:  Weakness. EXAM: PORTABLE CHEST 1 VIEW COMPARISON:  June 15, 2019. FINDINGS: The heart size and mediastinal contours are within normal limits. Both lungs are clear. The visualized skeletal structures are unremarkable. IMPRESSION: No active disease. Electronically Signed   By: Lupita Raider M.D.   On: 10/13/2020 14:21     Subjective: He is alert, he was oriented times 2.  He agrees to go to rehab.   Discharge Exam: Vitals:   10/16/20 2311 10/17/20 0742  BP: (!) 155/84 (!) 146/88  Pulse: 75 66  Resp: 16 17  Temp: 97.8 F (36.6 C) 98 F (36.7 C)  SpO2: 98% 100%     General: Pt is alert, awake, not in acute distress Cardiovascular: RRR, S1/S2 +, no rubs, no gallops Respiratory:  CTA bilaterally, no wheezing, no rhonchi Abdominal: Soft, NT, ND, bowel sounds + Extremities: no edema, no cyanosis    The results of significant diagnostics from this hospitalization (including imaging, microbiology, ancillary and laboratory) are listed below for reference.     Microbiology: Recent Results (from the past 240 hour(s))  Resp Panel by RT-PCR (Flu A&B, Covid) Nasopharyngeal Swab     Status: None   Collection Time: 10/13/20  1:46 PM   Specimen: Nasopharyngeal Swab; Nasopharyngeal(NP) swabs in vial transport medium  Result Value Ref Range Status   SARS Coronavirus 2 by RT PCR NEGATIVE NEGATIVE Final    Comment: (NOTE) SARS-CoV-2 target nucleic acids are NOT DETECTED.  The SARS-CoV-2 RNA is generally detectable in upper respiratory specimens during the acute phase of infection. The lowest concentration of SARS-CoV-2 viral copies this assay can detect is 138 copies/mL. A negative result does not preclude SARS-Cov-2 infection and should not be used as the sole basis for treatment or other patient management decisions. A negative result may occur with  improper specimen collection/handling, submission of specimen other than nasopharyngeal swab, presence of viral mutation(s) within the areas targeted  by this assay, and inadequate number of viral copies(<138 copies/mL). A negative result must be combined with clinical observations, patient history, and epidemiological information. The expected result is Negative.  Fact Sheet for Patients:  BloggerCourse.comhttps://www.fda.gov/media/152166/download  Fact Sheet for Healthcare Providers:  SeriousBroker.ithttps://www.fda.gov/media/152162/download  This test is no t yet approved or cleared by the [place] FDA and  has been authorized for detection and/or diagnosis of SARS-CoV-2 by FDA under an Emergency Use Authorization (EUA). This EUA will remain  in effect (meaning this test can be used) for the duration of the COVID-19 declaration under Section  564(b)(1) of the Act, 21 U.S.C.section 360bbb-3(b)(1), unless the authorization is terminated  or revoked sooner.       Influenza A by PCR NEGATIVE NEGATIVE Final   Influenza B by PCR NEGATIVE NEGATIVE Final    Comment: (NOTE) The Xpert Xpress SARS-CoV-2/FLU/RSV plus assay is intended as an aid in the diagnosis of influenza from Nasopharyngeal swab specimens and should not be used as a sole basis for treatment. Nasal washings and aspirates are unacceptable for Xpert Xpress SARS-CoV-2/FLU/RSV testing.  Fact Sheet for Patients: BloggerCourse.comhttps://www.fda.gov/media/152166/download  Fact Sheet for Healthcare Providers: SeriousBroker.ithttps://www.fda.gov/media/152162/download  This test is not yet approved or cleared by the [place] FDA and has been authorized for detection and/or diagnosis of SARS-CoV-2 by FDA under an Emergency Use Authorization (EUA). This EUA will remain in effect (meaning this test can be used) for the duration of the COVID-19 declaration under Section 564(b)(1) of the Act, 21 U.S.C. section 360bbb-3(b)(1), unless the authorization is terminated or revoked.  Performed at [facility], [address] [place], [place] [address]   Urine culture     Status: Abnormal   Collection Time: 10/13/20  1:46 PM   Specimen: Urine, Random  Result Value Ref Range Status   Specimen Description   Final    URINE, RANDOM Performed at [facility], [address] [place], [place] [address]    Special Requests   Final    NONE Performed at [facility], [address] [place], [place] [address]    Culture >=100,000 COLONIES/mL ESCHERICHIA COLI (A)  Final   Report Status 10/16/2020 FINAL  Final   Organism ID, Bacteria ESCHERICHIA COLI (A)  Final      Susceptibility   Escherichia coli - MIC*    AMPICILLIN >=32 RESISTANT Resistant     CEFAZOLIN <=4 SENSITIVE Sensitive     CEFEPIME <=0.12 SENSITIVE Sensitive     CEFTRIAXONE <=0.25 SENSITIVE Sensitive      CIPROFLOXACIN >=4 RESISTANT Resistant     GENTAMICIN <=1 SENSITIVE Sensitive     IMIPENEM <=0.25 SENSITIVE Sensitive     NITROFURANTOIN <=16 SENSITIVE Sensitive     TRIMETH/SULFA <=20 SENSITIVE Sensitive     AMPICILLIN/SULBACTAM 16 INTERMEDIATE Intermediate     PIP/TAZO <=4 SENSITIVE Sensitive     * >=100,000 COLONIES/mL ESCHERICHIA COLI  Resp Panel by RT-PCR (Flu A&B, Covid) Nasopharyngeal Swab     Status: None   Collection Time: 10/16/20 12:00 PM   Specimen: Nasopharyngeal Swab; Nasopharyngeal(NP) swabs in vial transport medium  Result Value Ref Range Status   SARS Coronavirus 2 by RT PCR NEGATIVE NEGATIVE Final    Comment: (NOTE) SARS-CoV-2 target nucleic acids are NOT DETECTED.  The SARS-CoV-2 RNA is generally detectable in upper respiratory specimens during the acute phase of infection. The lowest concentration of SARS-CoV-2 viral copies this assay can detect is 138 copies/mL. A negative result does not preclude SARS-Cov-2 infection and should not  be used as the sole basis for treatment or other patient management decisions. A negative result may occur with  improper specimen collection/handling, submission of specimen other than nasopharyngeal swab, presence of viral mutation(s) within the areas targeted by this assay, and inadequate number of viral copies(<138 copies/mL). A negative result must be combined with clinical observations, patient history, and epidemiological information. The expected result is Negative.  Fact Sheet for Patients:  BloggerCourse.com  Fact Sheet for Healthcare Providers:  SeriousBroker.it  This test is no t yet approved or cleared by the [place] FDA and  has been authorized for detection and/or diagnosis of SARS-CoV-2 by FDA under an Emergency Use Authorization (EUA). This EUA will remain  in effect (meaning this test can be used) for the duration of the COVID-19 declaration under Section  564(b)(1) of the Act, 21 U.S.C.section 360bbb-3(b)(1), unless the authorization is terminated  or revoked sooner.       Influenza A by PCR NEGATIVE NEGATIVE Final   Influenza B by PCR NEGATIVE NEGATIVE Final    Comment: (NOTE) The Xpert Xpress SARS-CoV-2/FLU/RSV plus assay is intended as an aid in the diagnosis of influenza from Nasopharyngeal swab specimens and should not be used as a sole basis for treatment. Nasal washings and aspirates are unacceptable for Xpert Xpress SARS-CoV-2/FLU/RSV testing.  Fact Sheet for Patients: BloggerCourse.com  Fact Sheet for Healthcare Providers: SeriousBroker.it  This test is not yet approved or cleared by the [place] FDA and has been authorized for detection and/or diagnosis of SARS-CoV-2 by FDA under an Emergency Use Authorization (EUA). This EUA will remain in effect (meaning this test can be used) for the duration of the COVID-19 declaration under Section 564(b)(1) of the Act, 21 U.S.C. section 360bbb-3(b)(1), unless the authorization is terminated or revoked.  Performed at [facility], [address] [place], [place] [address]      Labs: BNP (last 3 results) No results for input(s): BNP in the last 8760 hours. Basic Metabolic Panel: Recent Labs  Lab 10/13/20 1346 10/14/20 0508  NA 137 137  K 3.7 3.6  CL 104 105  CO2 24 23  GLUCOSE 117* 108*  BUN 8 6*  CREATININE 0.96 0.70  CALCIUM 8.8* 8.5*   Liver Function Tests: No results for input(s): AST, ALT, ALKPHOS, BILITOT, PROT, ALBUMIN in the last 168 hours. No results for input(s): LIPASE, AMYLASE in the last 168 hours. No results for input(s): AMMONIA in the last 168 hours. CBC: Recent Labs  Lab 10/13/20 1346 10/14/20 0508  WBC 4.4 5.4  NEUTROABS 2.8  --   HGB 14.5 15.5  HCT 45.0 46.7  MCV 90.4 90.2  PLT 142* 142*   Cardiac Enzymes: No results for input(s): CKTOTAL, CKMB, CKMBINDEX, TROPONINI in  the last 168 hours. BNP: Invalid input(s): POCBNP CBG: No results for input(s): GLUCAP in the last 168 hours. D-Dimer No results for input(s): DDIMER in the last 72 hours. Hgb A1c No results for input(s): HGBA1C in the last 72 hours. Lipid Profile No results for input(s): CHOL, HDL, LDLCALC, TRIG, CHOLHDL, LDLDIRECT in the last 72 hours. Thyroid function studies No results for input(s): TSH, T4TOTAL, T3FREE, THYROIDAB in the last 72 hours.  Invalid input(s): FREET3 Anemia work up Recent Labs    10/16/20 0424  VITAMINB12 120*   Urinalysis    Component Value Date/Time   COLORURINE YELLOW (A) 10/13/2020 1346   APPEARANCEUR CLOUDY (A) 10/13/2020 1346   LABSPEC 1.011 10/13/2020 1346   PHURINE 8.0 10/13/2020 1346  GLUCOSEU NEGATIVE 10/13/2020 1346   HGBUR NEGATIVE 10/13/2020 1346   BILIRUBINUR NEGATIVE 10/13/2020 1346   KETONESUR NEGATIVE 10/13/2020 1346   PROTEINUR NEGATIVE 10/13/2020 1346   NITRITE NEGATIVE 10/13/2020 1346   LEUKOCYTESUR LARGE (A) 10/13/2020 1346   Sepsis Labs Invalid input(s): PROCALCITONIN,  WBC,  LACTICIDVEN Microbiology Recent Results (from the past 240 hour(s))  Resp Panel by RT-PCR (Flu A&B, Covid) Nasopharyngeal Swab     Status: None   Collection Time: 10/13/20  1:46 PM   Specimen: Nasopharyngeal Swab; Nasopharyngeal(NP) swabs in vial transport medium  Result Value Ref Range Status   SARS Coronavirus 2 by RT PCR NEGATIVE NEGATIVE Final    Comment: (NOTE) SARS-CoV-2 target nucleic acids are NOT DETECTED.  The SARS-CoV-2 RNA is generally detectable in upper respiratory specimens during the acute phase of infection. The lowest concentration of SARS-CoV-2 viral copies this assay can detect is 138 copies/mL. A negative result does not preclude SARS-Cov-2 infection and should not be used as the sole basis for treatment or other patient management decisions. A negative result may occur with  improper specimen collection/handling, submission of  specimen other than nasopharyngeal swab, presence of viral mutation(s) within the areas targeted by this assay, and inadequate number of viral copies(<138 copies/mL). A negative result must be combined with clinical observations, patient history, and epidemiological information. The expected result is Negative.  Fact Sheet for Patients:  BloggerCourse.com  Fact Sheet for Healthcare Providers:  SeriousBroker.it  This test is no t yet approved or cleared by the [place] FDA and  has been authorized for detection and/or diagnosis of SARS-CoV-2 by FDA under an Emergency Use Authorization (EUA). This EUA will remain  in effect (meaning this test can be used) for the duration of the COVID-19 declaration under Section 564(b)(1) of the Act, 21 U.S.C.section 360bbb-3(b)(1), unless the authorization is terminated  or revoked sooner.       Influenza A by PCR NEGATIVE NEGATIVE Final   Influenza B by PCR NEGATIVE NEGATIVE Final    Comment: (NOTE) The Xpert Xpress SARS-CoV-2/FLU/RSV plus assay is intended as an aid in the diagnosis of influenza from Nasopharyngeal swab specimens and should not be used as a sole basis for treatment. Nasal washings and aspirates are unacceptable for Xpert Xpress SARS-CoV-2/FLU/RSV testing.  Fact Sheet for Patients: BloggerCourse.com  Fact Sheet for Healthcare Providers: SeriousBroker.it  This test is not yet approved or cleared by the [place] FDA and has been authorized for detection and/or diagnosis of SARS-CoV-2 by FDA under an Emergency Use Authorization (EUA). This EUA will remain in effect (meaning this test can be used) for the duration of the COVID-19 declaration under Section 564(b)(1) of the Act, 21 U.S.C. section 360bbb-3(b)(1), unless the authorization is terminated or revoked.  Performed at [facility], [address] [place], [place] [address]   Urine culture     Status: Abnormal   Collection Time: 10/13/20  1:46 PM   Specimen: Urine, Random  Result Value Ref Range Status   Specimen Description   Final    URINE, RANDOM Performed at [facility], [address], [place], [place] [address]    Special Requests   Final    NONE Performed at [facility], [address] [place], [place] [address]    Culture >=100,000 COLONIES/mL ESCHERICHIA COLI (A)  Final   Report Status 10/16/2020 FINAL  Final   Organism ID, Bacteria ESCHERICHIA COLI (A)  Final      Susceptibility   Escherichia  coli - MIC*    AMPICILLIN >=32 RESISTANT Resistant     CEFAZOLIN <=4 SENSITIVE Sensitive     CEFEPIME <=0.12 SENSITIVE Sensitive     CEFTRIAXONE <=0.25 SENSITIVE Sensitive     CIPROFLOXACIN >=4 RESISTANT Resistant     GENTAMICIN <=1 SENSITIVE Sensitive     IMIPENEM <=0.25 SENSITIVE Sensitive     NITROFURANTOIN <=16 SENSITIVE Sensitive     TRIMETH/SULFA <=20 SENSITIVE Sensitive     AMPICILLIN/SULBACTAM 16 INTERMEDIATE Intermediate     PIP/TAZO <=4 SENSITIVE Sensitive     * >=100,000 COLONIES/mL ESCHERICHIA COLI  Resp Panel by RT-PCR (Flu A&B, Covid) Nasopharyngeal Swab     Status: None   Collection Time: 10/16/20 12:00 PM   Specimen: Nasopharyngeal Swab; Nasopharyngeal(NP) swabs in vial transport medium  Result Value Ref Range Status   SARS Coronavirus 2 by RT PCR NEGATIVE NEGATIVE Final    Comment: (NOTE) SARS-CoV-2 target nucleic acids are NOT DETECTED.  The SARS-CoV-2 RNA is generally detectable in upper respiratory specimens during the acute phase of infection. The lowest concentration of SARS-CoV-2 viral copies this assay can detect is 138 copies/mL. A negative result does not preclude SARS-Cov-2 infection and should not be used as the sole basis for treatment or other patient management decisions. A negative result may occur with  improper specimen collection/handling, submission of  specimen other than nasopharyngeal swab, presence of viral mutation(s) within the areas targeted by this assay, and inadequate number of viral copies(<138 copies/mL). A negative result must be combined with clinical observations, patient history, and epidemiological information. The expected result is Negative.  Fact Sheet for Patients:  BloggerCourse.com  Fact Sheet for Healthcare Providers:  SeriousBroker.it  This test is no t yet approved or cleared by the [place] FDA and  has been authorized for detection and/or diagnosis of SARS-CoV-2 by FDA under an Emergency Use Authorization (EUA). This EUA will remain  in effect (meaning this test can be used) for the duration of the COVID-19 declaration under Section 564(b)(1) of the Act, 21 U.S.C.section 360bbb-3(b)(1), unless the authorization is terminated  or revoked sooner.       Influenza A by PCR NEGATIVE NEGATIVE Final   Influenza B by PCR NEGATIVE NEGATIVE Final    Comment: (NOTE) The Xpert Xpress SARS-CoV-2/FLU/RSV plus assay is intended as an aid in the diagnosis of influenza from Nasopharyngeal swab specimens and should not be used as a sole basis for treatment. Nasal washings and aspirates are unacceptable for Xpert Xpress SARS-CoV-2/FLU/RSV testing.  Fact Sheet for Patients: BloggerCourse.com  Fact Sheet for Healthcare Providers: SeriousBroker.it  This test is not yet approved or cleared by the [place] FDA and has been authorized for detection and/or diagnosis of SARS-CoV-2 by FDA under an Emergency Use Authorization (EUA). This EUA will remain in effect (meaning this test can be used) for the duration of the COVID-19 declaration under Section 564(b)(1) of the Act, 21 U.S.C. section 360bbb-3(b)(1), unless the authorization is terminated or revoked.  Performed at [facility], [address] [place], [place] [address]      Time coordinating discharge: 40 minutes  SIGNED:   Alba Cory, MD  [facility]

## 2020-10-17 NOTE — Care Management Important Message (Signed)
Important Message  Patient Details  Name: Brandon [name] MRN: [id number] Date of Birth: [birth year]   Medicare Important Message Given:  N/A - LOS <3 / Initial given by admissions  Initial Medicare IM reviewed with daughter, Leeroy Bock, by Caprice Renshaw, Patient Access Associate on 10/16/2020 at 12:01pm.   [name] 10/17/2020, 8:08 AM

## 2020-10-17 NOTE — TOC Progression Note (Signed)
Transition of Care [facility]) - Progression Note    Patient Details  Name: Brandon [name] MRN: [id number] Date of Birth: Mar 16, 1951  Transition of [facility]) CM/SW Contact  Allayne Butcher, RN Phone Number: 10/17/2020, 12:44 PM  Clinical Narrative:    Malvin Johns called to check on [facility] Berkley Harvey, when they started auth they faxed the clinicals to the wrong fax and now have to resubmit.  Berkley Harvey will likely not be back today.  [facility] cannot accept weekend admission.  Plan for discharge on Monday.    Expected Discharge Plan: Skilled Nursing Facility Barriers to Discharge: Insurance Authorization  Expected Discharge Plan and Services Expected Discharge Plan: Skilled Nursing Facility   Discharge Planning Services: CM Consult Post Acute Care Choice: Skilled Nursing Facility Living arrangements for the past 2 months: Apartment Expected Discharge Date: 10/16/20               DME Arranged: N/A DME Agency: NA                   Social Determinants of Health (SDOH) Interventions    Readmission Risk Interventions No flowsheet data found.

## 2020-10-18 DIAGNOSIS — G9341 Metabolic encephalopathy: Secondary | ICD-10-CM | POA: Diagnosis not present

## 2020-10-18 LAB — BASIC METABOLIC PANEL
Anion gap: 9 (ref 5–15)
BUN: 10 mg/dL (ref 8–23)
CO2: 21 mmol/L — ABNORMAL LOW (ref 22–32)
Calcium: 8.6 mg/dL — ABNORMAL LOW (ref 8.9–10.3)
Chloride: 105 mmol/L (ref 98–111)
Creatinine, Ser: 0.83 mg/dL (ref 0.61–1.24)
GFR, Estimated: >60 mL/min (ref >60)
Glucose, Bld: 87 mg/dL (ref 70–99)
Potassium: 4 mmol/L (ref 3.5–5.1)
Sodium: 135 mmol/L (ref 135–145)

## 2020-10-18 MED ORDER — SODIUM CHLORIDE 0.9 % IV SOLN
1.0000 g | INTRAVENOUS | Status: AC
  Administered 2020-10-18: 1 g via INTRAVENOUS
  Filled 2020-10-18: qty 1

## 2020-10-18 MED ORDER — CYANOCOBALAMIN 1000 MCG/ML IJ SOLN
1000.0000 ug | Freq: Once | INTRAMUSCULAR | Status: AC
  Administered 2020-10-18: 10:00:00 1000 ug via INTRAMUSCULAR
  Filled 2020-10-18: qty 1

## 2020-10-18 NOTE — Progress Notes (Signed)
Occupational Therapy Treatment Patient Details Name: Brandon [name] MRN: [id number] DOB: 01/15/1951 Today's Date: 10/18/2020    History of present illness The pt is a 70 y.o. 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  Patient was brought in by EMS after daughter had expressed concern with patient being altered mental status from his baseline.   OT comments  Pt seen for OT tx this date to f/u re: safety with ADLs/ADL mobility OT engages pt in STS trial x1 with MIN/MOD A for STS. Pt then requests to use commode d/t needing to have BM. MOD A to SPS to/from [facility], MAX/TOTAL A for peri care. Pt returned to chair with chair alarm. All needs met. RN notified of BM. Will continue to follow. Continue to recommend STR to increased strength and INDEP with ADLs/ADL mobility.    Follow Up Recommendations  SNF    Equipment Recommendations  Other (comment) (defer)    Recommendations for Other Services      Precautions / Restrictions Precautions Precautions: Fall Restrictions Weight Bearing Restrictions: No       Mobility Bed Mobility Overal bed mobility: Needs Assistance Bed Mobility: Supine to Sit     Supine to sit: Max assist     General bed mobility comments: up to chair pre/post    Transfers Overall transfer level: Needs assistance Equipment used: 1 person hand held assist Transfers: Stand Pivot Transfers;Sit to/from Stand Sit to Stand: Min assist;Mod assist Stand pivot transfers: Mod assist       General transfer comment: increased time, use of arm rests to push up, hand-held assist to balance    Balance Overall balance assessment: Needs assistance Sitting-balance support: Feet supported;Single extremity supported Sitting balance-Leahy Scale: Good Sitting balance - Comments: L UE supporting   Standing balance support: Single extremity supported Standing balance-Leahy Scale:  Poor Standing balance comment: reliant on UE support and external MOD A for support this session                           ADL either performed or assessed with clinical judgement   ADL Overall ADL's : Needs assistance/impaired                         Toilet Transfer: Moderate assistance;Stand-pivot;BSC   Toileting- Clothing Manipulation and Hygiene: Maximal assistance;Total assistance;Sitting/lateral lean               Vision Patient Visual Report: No change from baseline     Perception     Praxis      Cognition Arousal/Alertness: Awake/alert Behavior During Therapy: WFL for tasks assessed/performed;Impulsive Overall Cognitive Status: Difficult to assess                                 General Comments: pt is oriented to self and some aspects of situation, but continues to get confused about place intermittently throughout session stating "Oh, I'm gonna go to my apartment real quick".        Exercises Other Exercises Other Exercises: OT engages pt in STS trial x1 with MIN/MOD A for STS, MOD A to SPS to/from [facility], MAX/TOTAL A for peri care.   Shoulder Instructions       General Comments      Pertinent Vitals/ Pain  Pain Assessment: No/denies pain  Home Living                                          Prior Functioning/Environment              Frequency  Min 1X/week        Progress Toward Goals  OT Goals(current goals can now be found in the care plan section)  Progress towards OT goals: Progressing toward goals  Acute Rehab OT Goals Patient Stated Goal: none stated OT Goal Formulation: Patient unable to participate in goal setting Time For Goal Achievement: 10/29/20 Potential to Achieve Goals: Loop Discharge plan remains appropriate    Co-evaluation                 AM-PAC OT "6 Clicks" Daily Activity     Outcome Measure   Help from another person eating meals?: A  Little Help from another person taking care of personal grooming?: A Little Help from another person toileting, which includes using toliet, bedpan, or urinal?: Total Help from another person bathing (including washing, rinsing, drying)?: A Lot Help from another person to put on and taking off regular upper body clothing?: A Lot Help from another person to put on and taking off regular lower body clothing?: A Lot 6 Click Score: 13    End of Session Equipment Utilized During Treatment: Gait belt  OT Visit Diagnosis: Unsteadiness on feet (R26.81);Muscle weakness (generalized) (M62.81);Other symptoms and signs involving cognitive function   Activity Tolerance Patient tolerated treatment well   Patient Left in chair;with call bell/phone within reach;with chair alarm set   Nurse Communication Mobility status;Other (comment) (BM)        Time: 6579-0383 OT Time Calculation (min): 25 min  Charges: OT General Charges $OT Visit: 1 Visit OT Treatments $Self Care/Home Management : 8-22 mins $Therapeutic Activity: 8-22 mins  Gerrianne Scale, MS, OTR/L ascom [phone] 10/18/20, 4:26 PM

## 2020-10-18 NOTE — Progress Notes (Signed)
PROGRESS NOTE    CHRISTEPHER [name]  VHQ:[id number] DOB: [birth year] DOA: 10/13/2020 PCP: Sherron Monday, MD   Brief Narrative: 70 year old with past medical history significant for Alzheimer's dementia, history of CVA with right upper extremity weakness and hypertension presented to the ED due to altered mental status.  Patient was noted to be more confused by family.  No new focal deficit.  Patient admitted with acute metabolic encephalopathy secondary to UTI.   Assessment & Plan:   Active Problems:   Hx of completed stroke   Essential hypertension   UTI (urinary tract infection)   Acute metabolic encephalopathy   Hyperlipidemia   Neuropathy   Weakness   Chronic dementia without behavioral disturbance (HCC)   1-Acute metabolic encephalopathy:  likely secondary to infection Patient confuse on admission, worse than baseline.  Treated for  UTI B 12 low, getting supplement. Discharge on B 12 supplement.   2-UTI: Urine culture growing E. coli.  Sensitive to ceftriaxone He will complete 5 days treatment today.   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 Continue with PT  4-Hyperlipidemia: Continue with a statin 5-Neuropathy: Continue with gabapentin 6-Hypertension: Continue with hydralazine    Estimated body mass index is 27.12 kg/m as calculated from the following:   Height as of this encounter: 6' (1.829 m).   Weight as of this encounter: 90.7 kg.   DVT prophylaxis: lovenox Code Status: Full code Family Communication: Daughter updated this hospitalization  Disposition Plan:  Status is: Inpatient  Remains inpatient appropriate because:IV treatments appropriate due to intensity of illness or inability to take PO   Dispo: The patient is from: Home              Anticipated d/c is to: SNF              Patient currently is not medically stable to d/c.Awaiting authorization     Difficult to place patient No        Consultants:   None  Procedures:     Antimicrobials:  Ceftriaxone  Subjective: He is alert, pleasantly confuse  Objective: Vitals:   10/17/20 1555 10/17/20 2000 10/17/20 2327 10/18/20 0433  BP: (!) 149/89 (!) 158/97 (!) 156/89 (!) 149/88  Pulse: 69 67 69 84  Resp: 15 14 16 16   Temp: 97.9 F (36.6 C) 98 F (36.7 C) 98.1 F (36.7 C) 98.2 F (36.8 C)  TempSrc: Oral Oral Oral Oral  SpO2: 100% 99% 100% 98%  Weight:      Height:        Intake/Output Summary (Last 24 hours) at 10/18/2020 0730 Last data filed at 10/18/2020 0425 Gross per 24 hour  Intake --  Output 1125 ml  Net -1125 ml   Filed Weights   10/13/20 1328  Weight: 90.7 kg    Examination:  General exam: NAD Respiratory system: CTA Cardiovascular system: S 1, S 2 RRR Gastrointestinal system: BS present, soft, nt Central nervous system: Alert and oriented time 1.  Extremities: no edema   Data Reviewed: I have personally reviewed following labs and imaging studies  CBC: Recent Labs  Lab 10/13/20 1346 10/14/20 0508  WBC 4.4 5.4  NEUTROABS 2.8  --   HGB 14.5 15.5  HCT 45.0 46.7  MCV 90.4 90.2  PLT 142* 142*   Basic Metabolic Panel: Recent Labs  Lab 10/13/20 1346 10/14/20 0508  NA 137 137  K 3.7 3.6  CL 104 105  CO2  24 23  GLUCOSE 117* 108*  BUN 8 6*  CREATININE 0.96 0.70  CALCIUM 8.8* 8.5*   GFR: Estimated Creatinine Clearance: 93 mL/min (by C-G formula based on SCr of 0.7 mg/dL). Liver Function Tests: No results for input(s): AST, ALT, ALKPHOS, BILITOT, PROT, ALBUMIN in the last 168 hours. No results for input(s): LIPASE, AMYLASE in the last 168 hours. No results for input(s): AMMONIA in the last 168 hours. Coagulation Profile: No results for input(s): INR, PROTIME in the last 168 hours. Cardiac Enzymes: No results for input(s): CKTOTAL, CKMB, CKMBINDEX, TROPONINI in the last 168 hours. BNP (last 3 results) No results for input(s):  PROBNP in the last 8760 hours. HbA1C: No results for input(s): HGBA1C in the last 72 hours. CBG: No results for input(s): GLUCAP in the last 168 hours. Lipid Profile: No results for input(s): CHOL, HDL, LDLCALC, TRIG, CHOLHDL, LDLDIRECT in the last 72 hours. Thyroid Function Tests: No results for input(s): TSH, T4TOTAL, FREET4, T3FREE, THYROIDAB in the last 72 hours. Anemia Panel: Recent Labs    10/16/20 0424  VITAMINB12 120*   Sepsis Labs: No results for input(s): PROCALCITON, LATICACIDVEN in the last 168 hours.  Recent Results (from the past 240 hour(s))  Resp Panel by RT-PCR (Flu A&B, Covid) Nasopharyngeal Swab     Status: None   Collection Time: 10/13/20  1:46 PM   Specimen: Nasopharyngeal Swab; Nasopharyngeal(NP) swabs in vial transport medium  Result Value Ref Range Status   SARS Coronavirus 2 by RT PCR NEGATIVE NEGATIVE Final    Comment: (NOTE) SARS-CoV-2 target nucleic acids are NOT DETECTED.  The SARS-CoV-2 RNA is generally detectable in upper respiratory specimens during the acute phase of infection. The lowest concentration of SARS-CoV-2 viral copies this assay can detect is 138 copies/mL. A negative result does not preclude SARS-Cov-2 infection and should not be used as the sole basis for treatment or other patient management decisions. A negative result may occur with  improper specimen collection/handling, submission of specimen other than nasopharyngeal swab, presence of viral mutation(s) within the areas targeted by this assay, and inadequate number of viral copies(<138 copies/mL). A negative result must be combined with clinical observations, patient history, and epidemiological information. The expected result is Negative.  Fact Sheet for Patients:  BloggerCourse.com  Fact Sheet for Healthcare Providers:  SeriousBroker.it  This test is no t yet approved or cleared by the [place] FDA and  has been  authorized for detection and/or diagnosis of SARS-CoV-2 by FDA under an Emergency Use Authorization (EUA). This EUA will remain  in effect (meaning this test can be used) for the duration of the COVID-19 declaration under Section 564(b)(1) of the Act, 21 U.S.C.section 360bbb-3(b)(1), unless the authorization is terminated  or revoked sooner.       Influenza A by PCR NEGATIVE NEGATIVE Final   Influenza B by PCR NEGATIVE NEGATIVE Final    Comment: (NOTE) The Xpert Xpress SARS-CoV-2/FLU/RSV plus assay is intended as an aid in the diagnosis of influenza from Nasopharyngeal swab specimens and should not be used as a sole basis for treatment. Nasal washings and aspirates are unacceptable for Xpert Xpress SARS-CoV-2/FLU/RSV testing.  Fact Sheet for Patients: BloggerCourse.com  Fact Sheet for Healthcare Providers: SeriousBroker.it  This test is not yet approved or cleared by the [place] FDA and has been authorized for detection and/or diagnosis of SARS-CoV-2 by FDA under an Emergency Use Authorization (EUA). This EUA will remain in effect (meaning this test can be used) for the duration of the  COVID-19 declaration under Section 564(b)(1) of the Act, 21 U.S.C. section 360bbb-3(b)(1), unless the authorization is terminated or revoked.  Performed at [facility], [address] [place], [place] [address]   Urine culture     Status: Abnormal   Collection Time: 10/13/20  1:46 PM   Specimen: Urine, Random  Result Value Ref Range Status   Specimen Description   Final    URINE, RANDOM Performed at [facility], [address] [place], [place] [address]    Special Requests   Final    NONE Performed at [facility], [address] [place], [place] [address]    Culture >=100,000 COLONIES/mL ESCHERICHIA COLI (A)  Final   Report Status 10/16/2020 FINAL  Final   Organism ID, Bacteria ESCHERICHIA COLI  (A)  Final      Susceptibility   Escherichia coli - MIC*    AMPICILLIN >=32 RESISTANT Resistant     CEFAZOLIN <=4 SENSITIVE Sensitive     CEFEPIME <=0.12 SENSITIVE Sensitive     CEFTRIAXONE <=0.25 SENSITIVE Sensitive     CIPROFLOXACIN >=4 RESISTANT Resistant     GENTAMICIN <=1 SENSITIVE Sensitive     IMIPENEM <=0.25 SENSITIVE Sensitive     NITROFURANTOIN <=16 SENSITIVE Sensitive     TRIMETH/SULFA <=20 SENSITIVE Sensitive     AMPICILLIN/SULBACTAM 16 INTERMEDIATE Intermediate     PIP/TAZO <=4 SENSITIVE Sensitive     * >=100,000 COLONIES/mL ESCHERICHIA COLI  Resp Panel by RT-PCR (Flu A&B, Covid) Nasopharyngeal Swab     Status: None   Collection Time: 10/16/20 12:00 PM   Specimen: Nasopharyngeal Swab; Nasopharyngeal(NP) swabs in vial transport medium  Result Value Ref Range Status   SARS Coronavirus 2 by RT PCR NEGATIVE NEGATIVE Final    Comment: (NOTE) SARS-CoV-2 target nucleic acids are NOT DETECTED.  The SARS-CoV-2 RNA is generally detectable in upper respiratory specimens during the acute phase of infection. The lowest concentration of SARS-CoV-2 viral copies this assay can detect is 138 copies/mL. A negative result does not preclude SARS-Cov-2 infection and should not be used as the sole basis for treatment or other patient management decisions. A negative result may occur with  improper specimen collection/handling, submission of specimen other than nasopharyngeal swab, presence of viral mutation(s) within the areas targeted by this assay, and inadequate number of viral copies(<138 copies/mL). A negative result must be combined with clinical observations, patient history, and epidemiological information. The expected result is Negative.  Fact Sheet for Patients:  BloggerCourse.comhttps://www.fda.gov/media/152166/download  Fact Sheet for Healthcare Providers:  SeriousBroker.ithttps://www.fda.gov/media/152162/download  This test is no t yet approved or cleared by the [place] FDA and  has been  authorized for detection and/or diagnosis of SARS-CoV-2 by FDA under an Emergency Use Authorization (EUA). This EUA will remain  in effect (meaning this test can be used) for the duration of the COVID-19 declaration under Section 564(b)(1) of the Act, 21 U.S.C.section 360bbb-3(b)(1), unless the authorization is terminated  or revoked sooner.       Influenza A by PCR NEGATIVE NEGATIVE Final   Influenza B by PCR NEGATIVE NEGATIVE Final    Comment: (NOTE) The Xpert Xpress SARS-CoV-2/FLU/RSV plus assay is intended as an aid in the diagnosis of influenza from Nasopharyngeal swab specimens and should not be used as a sole basis for treatment. Nasal washings and aspirates are unacceptable for Xpert Xpress SARS-CoV-2/FLU/RSV testing.  Fact Sheet for Patients: BloggerCourse.comhttps://www.fda.gov/media/152166/download  Fact Sheet for Healthcare Providers: SeriousBroker.ithttps://www.fda.gov/media/152162/download  This test is not yet approved or cleared by the  [place] [employer] trader and has been authorized for detection and/or diagnosis of SARS-CoV-2 by FDA under an TEFL teacher (EUA). This EUA will remain in effect (meaning this test can be used) for the duration of the COVID-19 declaration under Section 564(b)(1) of the Act, 21 U.S.C. section 360bbb-3(b)(1), unless the authorization is terminated or revoked.  Performed at [facility], [address] [place], [place] [address]          Radiology Studies: No results found.      Scheduled Meds: . aspirin EC  81 mg Oral Daily  . atorvastatin  10 mg Oral Daily  . cyanocobalamin  1,000 mcg Intramuscular Once  . doxazosin  4 mg Oral Daily  . enoxaparin (LOVENOX) injection  40 mg Subcutaneous Q24H  . gabapentin  300 mg Oral TID  . hydrALAZINE  25 mg Oral Q8H  . oxybutynin  5 mg Oral QHS  . thiamine  100 mg Oral Daily   Continuous Infusions: . cefTRIAXone (ROCEPHIN)  IV       LOS: 5 days    Time spent: 35 minutes    Lisett Dirusso  A Yadiel Aubry, MD [facility]   If 7PM-7AM, please contact night-coverage [email or web address]  10/18/2020, 7:30 AM

## 2020-10-18 NOTE — Progress Notes (Signed)
Physical Therapy Treatment Patient Details Name: Brandon [name] MRN: [id number] DOB: [birth year] Today's Date: 10/18/2020    History of Present Illness The pt is a 70 y.o. 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  Patient was brought in by EMS after daughter had expressed concern with patient being altered mental status from his baseline.    PT Comments    Pt alert, agreeable to PT. purewick catheter adjusted at pt request due to "clogging", urine flow improved with adjustment. Pt assisted to sit EOB with maxA, more difficulty than previous session (did transfer to L side of the bed due to room set up.) good sitting balance noted, pt seemed to prefer at least unilateral UE support. Sit <> stand with modA, cued for technique and hand placement to increased pt safety. Untouched meal tray in room noted, pt set up for food and agreeable to eat at end of session. The patient would benefit from further skilled PT intervention to continue to progress towards goals. Recommendation remains appropriate due to pts decline in functional mobility compared to baseline.    Follow Up Recommendations  SNF     Equipment Recommendations  None recommended by PT    Recommendations for Other Services       Precautions / Restrictions Precautions Precautions: Fall Restrictions Weight Bearing Restrictions: No    Mobility  Bed Mobility Overal bed mobility: Needs Assistance Bed Mobility: Supine to Sit     Supine to sit: Max assist     General bed mobility comments: transfer to L due to room set up, maxA to complete    Transfers Overall transfer level: Needs assistance Equipment used: None Transfers: Stand Pivot Transfers   Stand pivot transfers: Mod assist          Ambulation/Gait             General Gait Details: deferred, pt nonambulatory at baseline   Stairs              Wheelchair Mobility    Modified Rankin (Stroke Patients Only)       Balance Overall balance assessment: Needs assistance Sitting-balance support: Feet supported;Single extremity supported Sitting balance-Leahy Scale: Good Sitting balance - Comments: L UE supporting   Standing balance support: Single extremity supported Standing balance-Leahy Scale: Poor Standing balance comment: reliant on UE support/PT support this session                            Cognition Arousal/Alertness: Awake/alert Behavior During Therapy: WFL for tasks assessed/performed;Impulsive Overall Cognitive Status: Difficult to assess                                 General Comments: Pt oriented to self only; family reported pt is most of the time oriented x3. Pt with some more speech today, hard to hear him due to being more soft spoken      Exercises      General Comments        Pertinent Vitals/Pain Pain Assessment: No/denies pain    Home Living                      Prior Function            PT Goals (current goals can now be found in the care plan  section) Progress towards PT goals: Progressing toward goals    Frequency    Min 2X/week      PT Plan Current plan remains appropriate    Co-evaluation              AM-PAC PT "6 Clicks" Mobility   Outcome Measure  Help needed turning from your back to your side while in a flat bed without using bedrails?: None Help needed moving from lying on your back to sitting on the side of a flat bed without using bedrails?: A Little Help needed moving to and from a bed to a chair (including a wheelchair)?: A Lot Help needed standing up from a chair using your arms (e.g., wheelchair or bedside chair)?: A Lot Help needed to walk in hospital room?: Total Help needed climbing 3-5 steps with a railing? : Total 6 Click Score: 13    End of Session Equipment Utilized During Treatment: Gait belt Activity  Tolerance: Patient tolerated treatment well Patient left: in chair;with call bell/phone within reach;with chair alarm set Nurse Communication: Mobility status PT Visit Diagnosis: Other abnormalities of gait and mobility (R26.89);Muscle weakness (generalized) (M62.81);Difficulty in walking, not elsewhere classified (R26.2)     Time: 1572-6203 PT Time Calculation (min) (ACUTE ONLY): 12 min  Charges:  $Therapeutic Activity: 8-22 mins                     Olga Coaster PT, DPT 2:54 PM,10/18/20

## 2020-10-19 DIAGNOSIS — G9341 Metabolic encephalopathy: Secondary | ICD-10-CM | POA: Diagnosis not present

## 2020-10-19 MED ORDER — VITAMIN B-12 1000 MCG PO TABS
1000.0000 ug | ORAL_TABLET | Freq: Every day | ORAL | Status: DC
  Administered 2020-10-19 – 2020-10-22 (×4): 1000 ug via ORAL
  Filled 2020-10-19 (×3): qty 1

## 2020-10-19 NOTE — Progress Notes (Signed)
PROGRESS NOTE    Brandon [name]  MVH:[id number] DOB: [birth year] DOA: 10/13/2020 PCP: Sherron Monday, MD   Brief Narrative: 70 year old with past medical history significant for Alzheimer's dementia, history of CVA with right upper extremity weakness and hypertension presented to the ED due to altered mental status.  Patient was noted to be more confused by family.  No new focal deficit.  Patient admitted with acute metabolic encephalopathy secondary to UTI.   Assessment & Plan:   Active Problems:   Hx of completed stroke   Essential hypertension   UTI (urinary tract infection)   Acute metabolic encephalopathy   Hyperlipidemia   Neuropathy   Weakness   Chronic dementia without behavioral disturbance (HCC)   1-Acute metabolic encephalopathy:  likely secondary to infection Patient confuse on admission, worse than baseline.  Treated for  UTI B 12 low, getting supplement. Discharge on B 12 supplement.   2-UTI: Urine culture growing E. coli.  Sensitive to ceftriaxone Completed 5 days antibiotics.   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 Continue with PT  4-Hyperlipidemia: Continue with a statin 5-Neuropathy: Continue with gabapentin 6-Hypertension: Continue with hydralazine    Estimated body mass index is 27.12 kg/m as calculated from the following:   Height as of this encounter: 6' (1.829 m).   Weight as of this encounter: 90.7 kg.   DVT prophylaxis: lovenox Code Status: Full code Family Communication: Daughter updated this hospitalization  Disposition Plan:  Status is: Inpatient  Remains inpatient appropriate because:IV treatments appropriate due to intensity of illness or inability to take PO   Dispo: The patient is from: Home              Anticipated d/c is to: SNF              Patient currently is not medically stable to d/c.Awaiting authorization    Difficult to  place patient No        Consultants:   None  Procedures:     Antimicrobials:  Ceftriaxone  Subjective: No new complaint. He is awaiting placement.   Objective: Vitals:   10/18/20 1647 10/18/20 2018 10/19/20 0010 10/19/20 0552  BP: (!) 158/72 (!) 150/109 130/60 100/75  Pulse: 72 66 70 71  Resp: 17 18 16 16   Temp: 97.9 F (36.6 C) 98.6 F (37 C) (!) 97.5 F (36.4 C) (!) 97.5 F (36.4 C)  TempSrc: Oral  Oral Oral  SpO2: 98% 100% 98% 100%  Weight:      Height:        Intake/Output Summary (Last 24 hours) at 10/19/2020 0725 Last data filed at 10/19/2020 0400 Gross per 24 hour  Intake 340 ml  Output 1000 ml  Net -660 ml   Filed Weights   10/13/20 1328  Weight: 90.7 kg    Examination:  General exam: NAD Respiratory system: CTA Cardiovascular system: S 1, S 2 RRR Gastrointestinal system:  BS present, soft, nt Central nervous system: alert  Extremities: no edema   Data Reviewed: I have personally reviewed following labs and imaging studies  CBC: Recent Labs  Lab 10/13/20 1346 10/14/20 0508  WBC 4.4 5.4  NEUTROABS 2.8  --   HGB 14.5 15.5  HCT 45.0 46.7  MCV 90.4 90.2  PLT 142* 142*   Basic Metabolic Panel: Recent Labs  Lab 10/13/20 1346 10/14/20 0508 10/18/20 0746  NA 137 137 135  K 3.7 3.6 4.0  CL 104 105  105  CO2 24 23 21*  GLUCOSE 117* 108* 87  BUN 8 6* 10  CREATININE 0.96 0.70 0.83  CALCIUM 8.8* 8.5* 8.6*   GFR: Estimated Creatinine Clearance: 89.6 mL/min (by C-G formula based on SCr of 0.83 mg/dL). Liver Function Tests: No results for input(s): AST, ALT, ALKPHOS, BILITOT, PROT, ALBUMIN in the last 168 hours. No results for input(s): LIPASE, AMYLASE in the last 168 hours. No results for input(s): AMMONIA in the last 168 hours. Coagulation Profile: No results for input(s): INR, PROTIME in the last 168 hours. Cardiac Enzymes: No results for input(s): CKTOTAL, CKMB, CKMBINDEX, TROPONINI in the last 168 hours. BNP (last 3  results) No results for input(s): PROBNP in the last 8760 hours. HbA1C: No results for input(s): HGBA1C in the last 72 hours. CBG: No results for input(s): GLUCAP in the last 168 hours. Lipid Profile: No results for input(s): CHOL, HDL, LDLCALC, TRIG, CHOLHDL, LDLDIRECT in the last 72 hours. Thyroid Function Tests: No results for input(s): TSH, T4TOTAL, FREET4, T3FREE, THYROIDAB in the last 72 hours. Anemia Panel: No results for input(s): VITAMINB12, FOLATE, FERRITIN, TIBC, IRON, RETICCTPCT in the last 72 hours. Sepsis Labs: No results for input(s): PROCALCITON, LATICACIDVEN in the last 168 hours.  Recent Results (from the past 240 hour(s))  Resp Panel by RT-PCR (Flu A&B, Covid) Nasopharyngeal Swab     Status: None   Collection Time: 10/13/20  1:46 PM   Specimen: Nasopharyngeal Swab; Nasopharyngeal(NP) swabs in vial transport medium  Result Value Ref Range Status   SARS Coronavirus 2 by RT PCR NEGATIVE NEGATIVE Final    Comment: (NOTE) SARS-CoV-2 target nucleic acids are NOT DETECTED.  The SARS-CoV-2 RNA is generally detectable in upper respiratory specimens during the acute phase of infection. The lowest concentration of SARS-CoV-2 viral copies this assay can detect is 138 copies/mL. A negative result does not preclude SARS-Cov-2 infection and should not be used as the sole basis for treatment or other patient management decisions. A negative result may occur with  improper specimen collection/handling, submission of specimen other than nasopharyngeal swab, presence of viral mutation(s) within the areas targeted by this assay, and inadequate number of viral copies(<138 copies/mL). A negative result must be combined with clinical observations, patient history, and epidemiological information. The expected result is Negative.  Fact Sheet for Patients:  BloggerCourse.com  Fact Sheet for Healthcare Providers:   SeriousBroker.it  This test is no t yet approved or cleared by the [place] FDA and  has been authorized for detection and/or diagnosis of SARS-CoV-2 by FDA under an Emergency Use Authorization (EUA). This EUA will remain  in effect (meaning this test can be used) for the duration of the COVID-19 declaration under Section 564(b)(1) of the Act, 21 U.S.C.section 360bbb-3(b)(1), unless the authorization is terminated  or revoked sooner.       Influenza A by PCR NEGATIVE NEGATIVE Final   Influenza B by PCR NEGATIVE NEGATIVE Final    Comment: (NOTE) The Xpert Xpress SARS-CoV-2/FLU/RSV plus assay is intended as an aid in the diagnosis of influenza from Nasopharyngeal swab specimens and should not be used as a sole basis for treatment. Nasal washings and aspirates are unacceptable for Xpert Xpress SARS-CoV-2/FLU/RSV testing.  Fact Sheet for Patients: BloggerCourse.com  Fact Sheet for Healthcare Providers: SeriousBroker.it  This test is not yet approved or cleared by the [place] FDA and has been authorized for detection and/or diagnosis of SARS-CoV-2 by FDA under an Emergency Use Authorization (EUA). This EUA will remain in effect (  meaning this test can be used) for the duration of the COVID-19 declaration under Section 564(b)(1) of the Act, 21 U.S.C. section 360bbb-3(b)(1), unless the authorization is terminated or revoked.  Performed at [facility], [address] [place], [place] [address]   Urine culture     Status: Abnormal   Collection Time: 10/13/20  1:46 PM   Specimen: Urine, Random  Result Value Ref Range Status   Specimen Description   Final    URINE, RANDOM Performed at [facility], [address] [place], [place] [address]    Special Requests   Final    NONE Performed at [facility], [address] [place], [place] [address]    Culture  >=100,000 COLONIES/mL ESCHERICHIA COLI (A)  Final   Report Status 10/16/2020 FINAL  Final   Organism ID, Bacteria ESCHERICHIA COLI (A)  Final      Susceptibility   Escherichia coli - MIC*    AMPICILLIN >=32 RESISTANT Resistant     CEFAZOLIN <=4 SENSITIVE Sensitive     CEFEPIME <=0.12 SENSITIVE Sensitive     CEFTRIAXONE <=0.25 SENSITIVE Sensitive     CIPROFLOXACIN >=4 RESISTANT Resistant     GENTAMICIN <=1 SENSITIVE Sensitive     IMIPENEM <=0.25 SENSITIVE Sensitive     NITROFURANTOIN <=16 SENSITIVE Sensitive     TRIMETH/SULFA <=20 SENSITIVE Sensitive     AMPICILLIN/SULBACTAM 16 INTERMEDIATE Intermediate     PIP/TAZO <=4 SENSITIVE Sensitive     * >=100,000 COLONIES/mL ESCHERICHIA COLI  Resp Panel by RT-PCR (Flu A&B, Covid) Nasopharyngeal Swab     Status: None   Collection Time: 10/16/20 12:00 PM   Specimen: Nasopharyngeal Swab; Nasopharyngeal(NP) swabs in vial transport medium  Result Value Ref Range Status   SARS Coronavirus 2 by RT PCR NEGATIVE NEGATIVE Final    Comment: (NOTE) SARS-CoV-2 target nucleic acids are NOT DETECTED.  The SARS-CoV-2 RNA is generally detectable in upper respiratory specimens during the acute phase of infection. The lowest concentration of SARS-CoV-2 viral copies this assay can detect is 138 copies/mL. A negative result does not preclude SARS-Cov-2 infection and should not be used as the sole basis for treatment or other patient management decisions. A negative result may occur with  improper specimen collection/handling, submission of specimen other than nasopharyngeal swab, presence of viral mutation(s) within the areas targeted by this assay, and inadequate number of viral copies(<138 copies/mL). A negative result must be combined with clinical observations, patient history, and epidemiological information. The expected result is Negative.  Fact Sheet for Patients:  BloggerCourse.com  Fact Sheet for Healthcare Providers:   SeriousBroker.it  This test is no t yet approved or cleared by the [place] FDA and  has been authorized for detection and/or diagnosis of SARS-CoV-2 by FDA under an Emergency Use Authorization (EUA). This EUA will remain  in effect (meaning this test can be used) for the duration of the COVID-19 declaration under Section 564(b)(1) of the Act, 21 U.S.C.section 360bbb-3(b)(1), unless the authorization is terminated  or revoked sooner.       Influenza A by PCR NEGATIVE NEGATIVE Final   Influenza B by PCR NEGATIVE NEGATIVE Final    Comment: (NOTE) The Xpert Xpress SARS-CoV-2/FLU/RSV plus assay is intended as an aid in the diagnosis of influenza from Nasopharyngeal swab specimens and should not be used as a sole basis for treatment. Nasal washings and aspirates are unacceptable for Xpert Xpress SARS-CoV-2/FLU/RSV testing.  Fact Sheet for Patients: BloggerCourse.com  Fact Sheet for Healthcare Providers: SeriousBroker.it  This test is not yet approved or cleared by the [place] FDA and has been authorized for detection and/or diagnosis of SARS-CoV-2 by FDA under an Emergency Use Authorization (EUA). This EUA will remain in effect (meaning this test can be used) for the duration of the COVID-19 declaration under Section 564(b)(1) of the Act, 21 U.S.C. section 360bbb-3(b)(1), unless the authorization is terminated or revoked.  Performed at [facility], [address] [place], [place] [id number]          Radiology Studies: No results found.      Scheduled Meds: . aspirin EC  81 mg Oral Daily  . atorvastatin  10 mg Oral Daily  . doxazosin  4 mg Oral Daily  . enoxaparin (LOVENOX) injection  40 mg Subcutaneous Q24H  . gabapentin  300 mg Oral TID  . hydrALAZINE  25 mg Oral Q8H  . oxybutynin  5 mg Oral QHS  . thiamine  100 mg Oral Daily   Continuous Infusions:    LOS: 6  days    Time spent: 35 minutes    Manasa Spease A Ericka Marcellus, MD [facility]   If 7PM-7AM, please contact night-coverage [email or web address]  10/19/2020, 7:25 AM

## 2020-10-20 DIAGNOSIS — F0390 Unspecified dementia without behavioral disturbance: Secondary | ICD-10-CM | POA: Diagnosis not present

## 2020-10-20 NOTE — Discharge Summary (Signed)
Physician Discharge Summary  Brandon [name]:[id number] DOB: 1950-06-09 DOA: 10/13/2020  PCP: Sherron Monday, MD  Admit date: 10/13/2020 Discharge date: 10/20/2020  Admitted From: Home  Disposition:  SNF  Recommendations for Outpatient Follow-up:  1. Follow up with PCP in 1-2 weeks 2. Please obtain BMP/CBC in one week 3. Needs B 12 labs in 4 weeks.  4. Follow up Thiamine results.   Discharge Condition: Stable.  CODE STATUS: Full Code.  Diet recommendation: Heart Healthy.  Brief/Interim Summary: 70 year old with past medical history significant for Alzheimer's dementia, history of CVA with right upper extremity weakness and hypertension presented to the ED due to altered mental status.  Patient was noted to be more confused by family.  No new focal deficit.  Patient admitted with acute metabolic encephalopathy secondary to UTI.   1-Acute metabolic encephalopathy:  likely secondary to infection Patient  continues to be confused Continue with treatment of UTI B 12 low, will supplement.  Continue to be stable.   2-UTI: Urine culture growing E. coli.  Follow sensitivity, continue with IV ceftriaxone Received 3 days ceftriaxone while in the hospital. Completed antibiotics in the hospital.   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    Stable for discharge  \ Discharge Diagnoses:  Active Problems:   Hx of completed stroke   Essential hypertension   UTI (urinary tract infection)   Acute metabolic encephalopathy   Hyperlipidemia   Neuropathy   Weakness   Chronic dementia without behavioral disturbance  [facility])    Discharge Instructions  Discharge Instructions    Diet - low sodium heart healthy   Complete by: As directed    Diet - low sodium heart healthy   Complete by: As directed    Increase activity slowly   Complete by: As directed    Increase activity slowly   Complete by: As directed      Allergies as of 10/20/2020   No Known Allergies     Medication List    STOP taking these medications   celecoxib 200 MG capsule Commonly known as: CELEBREX   zolpidem 5 MG tablet Commonly known as: AMBIEN     TAKE these medications   aspirin 81 MG tablet Take 81 mg by mouth daily.   atorvastatin 10 MG tablet Commonly known as: LIPITOR Take 10 mg by mouth daily.   donepezil 10 MG tablet Commonly known as: ARICEPT Take 10 mg by mouth every morning.   doxazosin 4 MG tablet Commonly known as: CARDURA Take 4 mg by mouth daily.   gabapentin 300 MG capsule Commonly known as: NEURONTIN Take 1 capsule (300 mg total) by mouth 3 (three) times daily.   hydrALAZINE 10 MG tablet Commonly known as: APRESOLINE Take 1 tablet (10 mg total) by mouth every 8 (eight) hours.   oxybutynin 5 MG 24 hr tablet Commonly known as: DITROPAN-XL Take 5 mg by mouth at bedtime.   thiamine 100 MG tablet Take 1 tablet (100 mg total) by mouth daily.   vitamin B-12 1000 MCG tablet Commonly known as: CYANOCOBALAMIN Take 1 tablet (1,000 mcg total) by mouth daily.       Contact information for follow-up providers    Sherron Monday, MD In 2  days.   Specialty: Internal Medicine Contact information: [address] [place] [address] [phone]        Please follow up.   Why: Facillity to make follow up appt           Contact information for after-discharge care    Destination    [facility] .   Service: Skilled Nursing Contact information: [address] [place] [address] [phone]                 No Known  Allergies  Consultations: None  Procedures/Studies: CT HEAD WO CONTRAST  Result Date: 10/13/2020 CLINICAL DATA:  Mental status change EXAM: CT HEAD WITHOUT CONTRAST TECHNIQUE: Contiguous axial images were obtained from the base of the skull through the vertex without intravenous contrast. COMPARISON:  June 15, 2019 FINDINGS: Brain: No evidence of acute large vascular territory infarction, hemorrhage, hydrocephalus, extra-axial collection or mass lesion/mass effect. Mild age related global parenchymal volume loss. 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 Vascular: No hyperdense vessel. Atherosclerotic calcifications of the internal carotid arteries at skull base. Skull: Normal. Negative for fracture or focal lesion. Sinuses/Orbits: The visualized paranasal sinuses and mastoid air cells are predominantly clear. Orbits are grossly unremarkable. Other: Dense cerumen in the right external auditory canal. IMPRESSION: 1. No acute intracranial findings. 2. 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 3. Dense cerumen in the right external auditory canal. Electronically Signed   By: Maudry Mayhew MD   On: 10/13/2020 22:23   DG Chest Portable 1 View  Result Date: 10/13/2020 CLINICAL DATA:  Weakness. EXAM: PORTABLE CHEST 1 VIEW COMPARISON:  June 15, 2019. FINDINGS: The heart size and mediastinal contours are within normal limits. Both lungs are clear. The visualized skeletal structures are unremarkable. IMPRESSION: No active disease. Electronically Signed   By: Lupita Raider M.D.   On: 10/13/2020 14:21     Subjective: He is alert, he was oriented times 2.  He agrees to go to rehab.   Discharge Exam: Vitals:   10/20/20 0337 10/20/20 0713  BP: 132/77 111/78  Pulse: 65 73  Resp: 16 18  Temp: 97.8 F (36.6 C) 97.8 F (36.6 C)  SpO2: 97% 97%     General: Pt is alert, awake, not in acute distress Cardiovascular: RRR, S1/S2 +, no rubs, no gallops Respiratory: CTA  bilaterally, no wheezing, no rhonchi Abdominal: Soft, NT, ND, bowel sounds + Extremities: no edema, no cyanosis    The results of significant diagnostics from this hospitalization (including imaging, microbiology, ancillary and laboratory) are listed below for reference.     Microbiology: Recent Results (from the past 240 hour(s))  Resp Panel by RT-PCR (Flu A&B, Covid) Nasopharyngeal Swab     Status: None   Collection Time: 10/13/20  1:46 PM   Specimen: Nasopharyngeal Swab; Nasopharyngeal(NP) swabs in vial transport medium  Result Value Ref Range Status   SARS Coronavirus 2 by RT PCR NEGATIVE NEGATIVE Final    Comment: (NOTE) SARS-CoV-2 target nucleic acids are NOT DETECTED.  The SARS-CoV-2 RNA is generally detectable in upper respiratory specimens during the acute phase of infection. The lowest concentration of SARS-CoV-2 viral copies this assay can detect is 138 copies/mL. A negative result does not preclude SARS-Cov-2 infection and should not be used as the sole basis for treatment or other patient management decisions. A negative result may occur with  improper specimen collection/handling, submission of specimen other than nasopharyngeal swab, presence of viral mutation(s) within the areas  targeted by this assay, and inadequate number of viral copies(<138 copies/mL). A negative result must be combined with clinical observations, patient history, and epidemiological information. The expected result is Negative.  Fact Sheet for Patients:  BloggerCourse.comhttps://www.fda.gov/media/152166/download  Fact Sheet for Healthcare Providers:  SeriousBroker.ithttps://www.fda.gov/media/152162/download  This test is no t yet approved or cleared by the [place] FDA and  has been authorized for detection and/or diagnosis of SARS-CoV-2 by FDA under an Emergency Use Authorization (EUA). This EUA will remain  in effect (meaning this test can be used) for the duration of the COVID-19 declaration under Section  564(b)(1) of the Act, 21 U.S.C.section 360bbb-3(b)(1), unless the authorization is terminated  or revoked sooner.       Influenza A by PCR NEGATIVE NEGATIVE Final   Influenza B by PCR NEGATIVE NEGATIVE Final    Comment: (NOTE) The Xpert Xpress SARS-CoV-2/FLU/RSV plus assay is intended as an aid in the diagnosis of influenza from Nasopharyngeal swab specimens and should not be used as a sole basis for treatment. Nasal washings and aspirates are unacceptable for Xpert Xpress SARS-CoV-2/FLU/RSV testing.  Fact Sheet for Patients: BloggerCourse.comhttps://www.fda.gov/media/152166/download  Fact Sheet for Healthcare Providers: SeriousBroker.ithttps://www.fda.gov/media/152162/download  This test is not yet approved or cleared by the [place] FDA and has been authorized for detection and/or diagnosis of SARS-CoV-2 by FDA under an Emergency Use Authorization (EUA). This EUA will remain in effect (meaning this test can be used) for the duration of the COVID-19 declaration under Section 564(b)(1) of the Act, 21 U.S.C. section 360bbb-3(b)(1), unless the authorization is terminated or revoked.  Performed at [facility], [address] [place], [place] [address]   Urine culture     Status: Abnormal   Collection Time: 10/13/20  1:46 PM   Specimen: Urine, Random  Result Value Ref Range Status   Specimen Description   Final    URINE, RANDOM Performed at [facility], [address] [place], [place] [address]    Special Requests   Final    NONE Performed at [facility], [address] [place], [place] [address]    Culture >=100,000 COLONIES/mL ESCHERICHIA COLI (A)  Final   Report Status 10/16/2020 FINAL  Final   Organism ID, Bacteria ESCHERICHIA COLI (A)  Final      Susceptibility   Escherichia coli - MIC*    AMPICILLIN >=32 RESISTANT Resistant     CEFAZOLIN <=4 SENSITIVE Sensitive     CEFEPIME <=0.12 SENSITIVE Sensitive     CEFTRIAXONE <=0.25 SENSITIVE Sensitive      CIPROFLOXACIN >=4 RESISTANT Resistant     GENTAMICIN <=1 SENSITIVE Sensitive     IMIPENEM <=0.25 SENSITIVE Sensitive     NITROFURANTOIN <=16 SENSITIVE Sensitive     TRIMETH/SULFA <=20 SENSITIVE Sensitive     AMPICILLIN/SULBACTAM 16 INTERMEDIATE Intermediate     PIP/TAZO <=4 SENSITIVE Sensitive     * >=100,000 COLONIES/mL ESCHERICHIA COLI  Resp Panel by RT-PCR (Flu A&B, Covid) Nasopharyngeal Swab     Status: None   Collection Time: 10/16/20 12:00 PM   Specimen: Nasopharyngeal Swab; Nasopharyngeal(NP) swabs in vial transport medium  Result Value Ref Range Status   SARS Coronavirus 2 by RT PCR NEGATIVE NEGATIVE Final    Comment: (NOTE) SARS-CoV-2 target nucleic acids are NOT DETECTED.  The SARS-CoV-2 RNA is generally detectable in upper respiratory specimens during the acute phase of infection. The lowest concentration of SARS-CoV-2 viral copies this assay can detect is 138 copies/mL. A negative result does not preclude SARS-Cov-2 infection and should  not be used as the sole basis for treatment or other patient management decisions. A negative result may occur with  improper specimen collection/handling, submission of specimen other than nasopharyngeal swab, presence of viral mutation(s) within the areas targeted by this assay, and inadequate number of viral copies(<138 copies/mL). A negative result must be combined with clinical observations, patient history, and epidemiological information. The expected result is Negative.  Fact Sheet for Patients:  BloggerCourse.com  Fact Sheet for Healthcare Providers:  SeriousBroker.it  This test is no t yet approved or cleared by the [place] FDA and  has been authorized for detection and/or diagnosis of SARS-CoV-2 by FDA under an Emergency Use Authorization (EUA). This EUA will remain  in effect (meaning this test can be used) for the duration of the COVID-19 declaration under Section  564(b)(1) of the Act, 21 U.S.C.section 360bbb-3(b)(1), unless the authorization is terminated  or revoked sooner.       Influenza A by PCR NEGATIVE NEGATIVE Final   Influenza B by PCR NEGATIVE NEGATIVE Final    Comment: (NOTE) The Xpert Xpress SARS-CoV-2/FLU/RSV plus assay is intended as an aid in the diagnosis of influenza from Nasopharyngeal swab specimens and should not be used as a sole basis for treatment. Nasal washings and aspirates are unacceptable for Xpert Xpress SARS-CoV-2/FLU/RSV testing.  Fact Sheet for Patients: BloggerCourse.com  Fact Sheet for Healthcare Providers: SeriousBroker.it  This test is not yet approved or cleared by the [place] FDA and has been authorized for detection and/or diagnosis of SARS-CoV-2 by FDA under an Emergency Use Authorization (EUA). This EUA will remain in effect (meaning this test can be used) for the duration of the COVID-19 declaration under Section 564(b)(1) of the Act, 21 U.S.C. section 360bbb-3(b)(1), unless the authorization is terminated or revoked.  Performed at [facility], [address] [place], [place] [address]      Labs: BNP (last 3 results) No results for input(s): BNP in the last 8760 hours. Basic Metabolic Panel: Recent Labs  Lab 10/13/20 1346 10/14/20 0508 10/18/20 0746  NA 137 137 135  K 3.7 3.6 4.0  CL 104 105 105  CO2 24 23 21*  GLUCOSE 117* 108* 87  BUN 8 6* 10  CREATININE 0.96 0.70 0.83  CALCIUM 8.8* 8.5* 8.6*   Liver Function Tests: No results for input(s): AST, ALT, ALKPHOS, BILITOT, PROT, ALBUMIN in the last 168 hours. No results for input(s): LIPASE, AMYLASE in the last 168 hours. No results for input(s): AMMONIA in the last 168 hours. CBC: Recent Labs  Lab 10/13/20 1346 10/14/20 0508  WBC 4.4 5.4  NEUTROABS 2.8  --   HGB 14.5 15.5  HCT 45.0 46.7  MCV 90.4 90.2  PLT 142* 142*   Cardiac Enzymes: No results for  input(s): CKTOTAL, CKMB, CKMBINDEX, TROPONINI in the last 168 hours. BNP: Invalid input(s): POCBNP CBG: No results for input(s): GLUCAP in the last 168 hours. D-Dimer No results for input(s): DDIMER in the last 72 hours. Hgb A1c No results for input(s): HGBA1C in the last 72 hours. Lipid Profile No results for input(s): CHOL, HDL, LDLCALC, TRIG, CHOLHDL, LDLDIRECT in the last 72 hours. Thyroid function studies No results for input(s): TSH, T4TOTAL, T3FREE, THYROIDAB in the last 72 hours.  Invalid input(s): FREET3 Anemia work up No results for input(s): VITAMINB12, FOLATE, FERRITIN, TIBC, IRON, RETICCTPCT in the last 72 hours. Urinalysis    Component Value Date/Time   COLORURINE YELLOW (A) 10/13/2020 1346   APPEARANCEUR CLOUDY (A) 10/13/2020 1346  LABSPEC 1.011 10/13/2020 1346   PHURINE 8.0 10/13/2020 1346   GLUCOSEU NEGATIVE 10/13/2020 1346   HGBUR NEGATIVE 10/13/2020 1346   BILIRUBINUR NEGATIVE 10/13/2020 1346   KETONESUR NEGATIVE 10/13/2020 1346   PROTEINUR NEGATIVE 10/13/2020 1346   NITRITE NEGATIVE 10/13/2020 1346   LEUKOCYTESUR LARGE (A) 10/13/2020 1346   Sepsis Labs Invalid input(s): PROCALCITONIN,  WBC,  LACTICIDVEN Microbiology Recent Results (from the past 240 hour(s))  Resp Panel by RT-PCR (Flu A&B, Covid) Nasopharyngeal Swab     Status: None   Collection Time: 10/13/20  1:46 PM   Specimen: Nasopharyngeal Swab; Nasopharyngeal(NP) swabs in vial transport medium  Result Value Ref Range Status   SARS Coronavirus 2 by RT PCR NEGATIVE NEGATIVE Final    Comment: (NOTE) SARS-CoV-2 target nucleic acids are NOT DETECTED.  The SARS-CoV-2 RNA is generally detectable in upper respiratory specimens during the acute phase of infection. The lowest concentration of SARS-CoV-2 viral copies this assay can detect is 138 copies/mL. A negative result does not preclude SARS-Cov-2 infection and should not be used as the sole basis for treatment or other patient management  decisions. A negative result may occur with  improper specimen collection/handling, submission of specimen other than nasopharyngeal swab, presence of viral mutation(s) within the areas targeted by this assay, and inadequate number of viral copies(<138 copies/mL). A negative result must be combined with clinical observations, patient history, and epidemiological information. The expected result is Negative.  Fact Sheet for Patients:  BloggerCourse.com  Fact Sheet for Healthcare Providers:  SeriousBroker.it  This test is no t yet approved or cleared by the [place] FDA and  has been authorized for detection and/or diagnosis of SARS-CoV-2 by FDA under an Emergency Use Authorization (EUA). This EUA will remain  in effect (meaning this test can be used) for the duration of the COVID-19 declaration under Section 564(b)(1) of the Act, 21 U.S.C.section 360bbb-3(b)(1), unless the authorization is terminated  or revoked sooner.       Influenza A by PCR NEGATIVE NEGATIVE Final   Influenza B by PCR NEGATIVE NEGATIVE Final    Comment: (NOTE) The Xpert Xpress SARS-CoV-2/FLU/RSV plus assay is intended as an aid in the diagnosis of influenza from Nasopharyngeal swab specimens and should not be used as a sole basis for treatment. Nasal washings and aspirates are unacceptable for Xpert Xpress SARS-CoV-2/FLU/RSV testing.  Fact Sheet for Patients: BloggerCourse.com  Fact Sheet for Healthcare Providers: SeriousBroker.it  This test is not yet approved or cleared by the [place] FDA and has been authorized for detection and/or diagnosis of SARS-CoV-2 by FDA under an Emergency Use Authorization (EUA). This EUA will remain in effect (meaning this test can be used) for the duration of the COVID-19 declaration under Section 564(b)(1) of the Act, 21 U.S.C. section 360bbb-3(b)(1), unless the  authorization is terminated or revoked.  Performed at [facility], [address], [place], [place] [address]   Urine culture     Status: Abnormal   Collection Time: 10/13/20  1:46 PM   Specimen: Urine, Random  Result Value Ref Range Status   Specimen Description   Final    URINE, RANDOM Performed at [facility], [address], [place], [place] [address]    Special Requests   Final    NONE Performed at [facility], [address] [place], [place] [address]    Culture >=100,000 COLONIES/mL ESCHERICHIA COLI (A)  Final   Report Status 10/16/2020 FINAL  Final   Organism ID, Bacteria ESCHERICHIA COLI (  A)  Final      Susceptibility   Escherichia coli - MIC*    AMPICILLIN >=32 RESISTANT Resistant     CEFAZOLIN <=4 SENSITIVE Sensitive     CEFEPIME <=0.12 SENSITIVE Sensitive     CEFTRIAXONE <=0.25 SENSITIVE Sensitive     CIPROFLOXACIN >=4 RESISTANT Resistant     GENTAMICIN <=1 SENSITIVE Sensitive     IMIPENEM <=0.25 SENSITIVE Sensitive     NITROFURANTOIN <=16 SENSITIVE Sensitive     TRIMETH/SULFA <=20 SENSITIVE Sensitive     AMPICILLIN/SULBACTAM 16 INTERMEDIATE Intermediate     PIP/TAZO <=4 SENSITIVE Sensitive     * >=100,000 COLONIES/mL ESCHERICHIA COLI  Resp Panel by RT-PCR (Flu A&B, Covid) Nasopharyngeal Swab     Status: None   Collection Time: 10/16/20 12:00 PM   Specimen: Nasopharyngeal Swab; Nasopharyngeal(NP) swabs in vial transport medium  Result Value Ref Range Status   SARS Coronavirus 2 by RT PCR NEGATIVE NEGATIVE Final    Comment: (NOTE) SARS-CoV-2 target nucleic acids are NOT DETECTED.  The SARS-CoV-2 RNA is generally detectable in upper respiratory specimens during the acute phase of infection. The lowest concentration of SARS-CoV-2 viral copies this assay can detect is 138 copies/mL. A negative result does not preclude SARS-Cov-2 infection and should not be used as the sole basis for treatment or other patient management  decisions. A negative result may occur with  improper specimen collection/handling, submission of specimen other than nasopharyngeal swab, presence of viral mutation(s) within the areas targeted by this assay, and inadequate number of viral copies(<138 copies/mL). A negative result must be combined with clinical observations, patient history, and epidemiological information. The expected result is Negative.  Fact Sheet for Patients:  BloggerCourse.com  Fact Sheet for Healthcare Providers:  SeriousBroker.it  This test is no t yet approved or cleared by the [place] FDA and  has been authorized for detection and/or diagnosis of SARS-CoV-2 by FDA under an Emergency Use Authorization (EUA). This EUA will remain  in effect (meaning this test can be used) for the duration of the COVID-19 declaration under Section 564(b)(1) of the Act, 21 U.S.C.section 360bbb-3(b)(1), unless the authorization is terminated  or revoked sooner.       Influenza A by PCR NEGATIVE NEGATIVE Final   Influenza B by PCR NEGATIVE NEGATIVE Final    Comment: (NOTE) The Xpert Xpress SARS-CoV-2/FLU/RSV plus assay is intended as an aid in the diagnosis of influenza from Nasopharyngeal swab specimens and should not be used as a sole basis for treatment. Nasal washings and aspirates are unacceptable for Xpert Xpress SARS-CoV-2/FLU/RSV testing.  Fact Sheet for Patients: BloggerCourse.com  Fact Sheet for Healthcare Providers: SeriousBroker.it  This test is not yet approved or cleared by the [place] FDA and has been authorized for detection and/or diagnosis of SARS-CoV-2 by FDA under an Emergency Use Authorization (EUA). This EUA will remain in effect (meaning this test can be used) for the duration of the COVID-19 declaration under Section 564(b)(1) of the Act, 21 U.S.C. section 360bbb-3(b)(1), unless the  authorization is terminated or revoked.  Performed at [facility], [address] [place], [place] [address]      Time coordinating discharge: 40 minutes  SIGNED:   Alba Cory, MD  [facility]

## 2020-10-20 NOTE — Care Management Important Message (Signed)
Important Message  Patient Details  Name: BRODIN GELPI MRN: [id number] Date of Birth: 1951/04/17   Medicare Important Message Given:  Yes     [name] 10/20/2020, 11:12 AM

## 2020-10-21 DIAGNOSIS — G9341 Metabolic encephalopathy: Secondary | ICD-10-CM | POA: Diagnosis not present

## 2020-10-21 NOTE — TOC Progression Note (Signed)
Transition of Care [facility]) - Progression Note    Patient Details  Name: Brandon [name] MRN: [id number] Date of Birth: 1950/06/01  Transition of Care [facility] CM/SW Contact  Hetty Ely, RN Phone Number: 10/21/2020, 8:37 AM  Clinical Narrative: [name] for Authorization and discharge information was informed that they no longer had the bed; however their sister Facility [facility] and [address] Can take him. I called patients daughter informed her of issue and she feel that the sister facility that is located in [place] will not work and prefer to stay local. [facility] had [facility] bed offer, daughter was receptive to that facility and will discuss with patient. [name], daughter says patient has some dementia and is currently upset because motorized wheelchair is not with him, therefore she will discuss facility with him.   I have text [name] to inquire about bed offer, will continue to track and keep daughter informed of updates.      Expected Discharge Plan: Skilled Nursing Facility Barriers to Discharge: Insurance Authorization  Expected Discharge Plan and Services Expected Discharge Plan: Skilled Nursing Facility   Discharge Planning Services: CM Consult Post Acute Care Choice: Skilled Nursing Facility Living arrangements for the past 2 months: Apartment Expected Discharge Date: 10/20/20               DME Arranged: N/A DME Agency: NA                   Social Determinants of Health (SDOH) Interventions    Readmission Risk Interventions No flowsheet data found.

## 2020-10-21 NOTE — Discharge Summary (Signed)
Physician Discharge Summary  Brandon [name] E Berkel ION:[id number] DOB: 06/21/1950 DOA: 10/13/2020  PCP: Sherron Mondayejan-Sie, S Ahmed, MD  Admit date: 10/13/2020 Discharge date: 10/21/2020  Admitted From: Home  Disposition:  SNF  Recommendations for Outpatient Follow-up:  1. Follow up with PCP in 1-2 weeks 2. Please obtain BMP/CBC in one week 3. Needs B 12 labs in 4 weeks.  4. Follow up Thiamine results.   Discharge Condition: Stable.  CODE STATUS: Full Code.  Diet recommendation: Heart Healthy.  Brief/Interim Summary: 70 year old with past medical history significant for Alzheimer's dementia, history of CVA with right upper extremity weakness and hypertension presented to the ED due to altered mental status.  Patient was noted to be more confused by family.  No new focal deficit.  Patient admitted with acute metabolic encephalopathy secondary to UTI.   1-Acute metabolic encephalopathy:  likely secondary to infection Patient  continues to be confused, oriented to person. He continue to have memery problems, hopefully will improve with B 12 supplement.  Continue with treatment of UTI B 12 low, will supplement.  Continue to be stable.   2-UTI: Urine culture growing E. coli.  Follow sensitivity, continue with IV ceftriaxone Received 3 days ceftriaxone while in the hospital. Completed antibiotics in the hospital.   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    Awaiting SNF for rehab \ Discharge Diagnoses:  Active Problems:   Hx of completed stroke   Essential hypertension   UTI (urinary tract infection)   Acute metabolic encephalopathy    Hyperlipidemia   Neuropathy   Weakness   Chronic dementia without behavioral disturbance [facility](HCC)    Discharge Instructions  Discharge Instructions    Diet - low sodium heart healthy   Complete by: As directed    Diet - low sodium heart healthy   Complete by: As directed    Increase activity slowly   Complete by: As directed    Increase activity slowly   Complete by: As directed      Allergies as of 10/21/2020   No Known Allergies     Medication List    STOP taking these medications   celecoxib 200 MG capsule Commonly known as: CELEBREX   zolpidem 5 MG tablet Commonly known as: AMBIEN     TAKE these medications   aspirin 81 MG tablet Take 81 mg by mouth daily.   atorvastatin 10 MG tablet Commonly known as: LIPITOR Take 10 mg by mouth daily.   donepezil 10 MG tablet Commonly known as: ARICEPT Take 10 mg by mouth every morning.   doxazosin 4 MG tablet Commonly known as: CARDURA Take 4 mg by mouth daily.   gabapentin 300 MG capsule Commonly known as: NEURONTIN Take 1 capsule (300 mg total) by mouth 3 (three) times daily.   hydrALAZINE 10 MG tablet Commonly known as: APRESOLINE Take 1 tablet (10 mg total) by mouth every 8 (eight) hours.   oxybutynin 5 MG 24 hr tablet Commonly known as: DITROPAN-XL Take 5 mg by mouth at bedtime.   thiamine 100 MG tablet Take 1 tablet (100 mg total) by mouth daily.   vitamin B-12 1000 MCG tablet Commonly known as: CYANOCOBALAMIN Take 1 tablet (1,000 mcg total) by mouth daily.  Contact information for follow-up providers    Sherron Monday, MD In 2 days.   Specialty: Internal Medicine Contact information: [address] [place] [address] [phone]        Please follow up.   Why: Facillity to make follow up appt           Contact information for after-discharge care    Destination    [facility]erred [facility]F .   Service: Skilled Nursing Contact information: [address] [place] [address] [phone]                 No Known Allergies  Consultations: None  Procedures/Studies: CT HEAD WO CONTRAST  Result Date: 10/13/2020 CLINICAL DATA:  Mental status change EXAM: CT HEAD WITHOUT CONTRAST TECHNIQUE: Contiguous axial images were obtained from the base of the skull through the vertex without intravenous contrast. COMPARISON:  June 15, 2019 FINDINGS: Brain: No evidence of acute large vascular territory infarction, hemorrhage, hydrocephalus, extra-axial collection or mass lesion/mass effect. Mild age related global parenchymal volume loss. 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 Vascular: No hyperdense vessel. Atherosclerotic calcifications of the internal carotid arteries at skull base. Skull: Normal. Negative for fracture or focal lesion. Sinuses/Orbits: The visualized paranasal sinuses and mastoid air cells are predominantly clear. Orbits are grossly unremarkable. Other: Dense cerumen in the right external auditory canal. IMPRESSION: 1. No acute intracranial findings. 2. 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 3. Dense cerumen in the right external auditory canal. Electronically Signed   By: Maudry Mayhew MD   On: 10/13/2020 22:23   DG Chest Portable 1 View  Result Date: 10/13/2020 CLINICAL DATA:  Weakness. EXAM: PORTABLE CHEST 1 VIEW COMPARISON:  June 15, 2019. FINDINGS: The heart size and mediastinal contours are within normal limits. Both lungs are clear. The visualized skeletal structures are unremarkable. IMPRESSION: No active disease. Electronically Signed   By: Lupita Raider M.D.   On: 10/13/2020 14:21     Subjective: He is alert, he was oriented times 2.  He agrees to go to rehab.   Discharge Exam: Vitals:   10/21/20 0832 10/21/20 1000  BP: (!) 147/93   Pulse: 71   Resp: 15   Temp: (!) 97.3 F (36.3 C) 98 F (36.7 C)  SpO2: 98%      General: Pt is alert, awake, not in acute  distress Cardiovascular: RRR, S1/S2 +, no rubs, no gallops Respiratory: CTA bilaterally, no wheezing, no rhonchi Abdominal: Soft, NT, ND, bowel sounds + Extremities: no edema, no cyanosis    The results of significant diagnostics from this hospitalization (including imaging, microbiology, ancillary and laboratory) are listed below for reference.     Microbiology: Recent Results (from the past 240 hour(s))  Resp Panel by RT-PCR (Flu A&B, Covid) Nasopharyngeal Swab     Status: None   Collection Time: 10/13/20  1:46 PM   Specimen: Nasopharyngeal Swab; Nasopharyngeal(NP) swabs in vial transport medium  Result Value Ref Range Status   SARS Coronavirus 2 by RT PCR NEGATIVE NEGATIVE Final    Comment: (NOTE) SARS-CoV-2 target nucleic acids are NOT DETECTED.  The SARS-CoV-2 RNA is generally detectable in upper respiratory specimens during the acute phase of infection. The lowest concentration of SARS-CoV-2 viral copies this assay can detect is 138 copies/mL. A negative result does not preclude SARS-Cov-2 infection and should not be used as the sole basis for treatment or other patient management decisions. A negative result may occur with  improper  specimen collection/handling, submission of specimen other than nasopharyngeal swab, presence of viral mutation(s) within the areas targeted by this assay, and inadequate number of viral copies(<138 copies/mL). A negative result must be combined with clinical observations, patient history, and epidemiological information. The expected result is Negative.  Fact Sheet for Patients:  BloggerCourse.com  Fact Sheet for Healthcare Providers:  SeriousBroker.it  This test is no t yet approved or cleared by the [place] FDA and  has been authorized for detection and/or diagnosis of SARS-CoV-2 by FDA under an Emergency Use Authorization (EUA). This EUA will remain  in effect (meaning this test  can be used) for the duration of the COVID-19 declaration under Section 564(b)(1) of the Act, 21 U.S.C.section 360bbb-3(b)(1), unless the authorization is terminated  or revoked sooner.       Influenza A by PCR NEGATIVE NEGATIVE Final   Influenza B by PCR NEGATIVE NEGATIVE Final    Comment: (NOTE) The Xpert Xpress SARS-CoV-2/FLU/RSV plus assay is intended as an aid in the diagnosis of influenza from Nasopharyngeal swab specimens and should not be used as a sole basis for treatment. Nasal washings and aspirates are unacceptable for Xpert Xpress SARS-CoV-2/FLU/RSV testing.  Fact Sheet for Patients: BloggerCourse.com  Fact Sheet for Healthcare Providers: SeriousBroker.it  This test is not yet approved or cleared by the [place] FDA and has been authorized for detection and/or diagnosis of SARS-CoV-2 by FDA under an Emergency Use Authorization (EUA). This EUA will remain in effect (meaning this test can be used) for the duration of the COVID-19 declaration under Section 564(b)(1) of the Act, 21 U.S.C. section 360bbb-3(b)(1), unless the authorization is terminated or revoked.  Performed at [facility], [address] [place], [place] [address]   Urine culture     Status: Abnormal   Collection Time: 10/13/20  1:46 PM   Specimen: Urine, Random  Result Value Ref Range Status   Specimen Description   Final    URINE, RANDOM Performed at [facility], [address] [place], [place] [address]    Special Requests   Final    NONE Performed at [facility], [address] [place], [place] [address]    Culture >=100,000 COLONIES/mL ESCHERICHIA COLI (A)  Final   Report Status 10/16/2020 FINAL  Final   Organism ID, Bacteria ESCHERICHIA COLI (A)  Final      Susceptibility   Escherichia coli - MIC*    AMPICILLIN >=32 RESISTANT Resistant     CEFAZOLIN <=4 SENSITIVE Sensitive     CEFEPIME <=0.12  SENSITIVE Sensitive     CEFTRIAXONE <=0.25 SENSITIVE Sensitive     CIPROFLOXACIN >=4 RESISTANT Resistant     GENTAMICIN <=1 SENSITIVE Sensitive     IMIPENEM <=0.25 SENSITIVE Sensitive     NITROFURANTOIN <=16 SENSITIVE Sensitive     TRIMETH/SULFA <=20 SENSITIVE Sensitive     AMPICILLIN/SULBACTAM 16 INTERMEDIATE Intermediate     PIP/TAZO <=4 SENSITIVE Sensitive     * >=100,000 COLONIES/mL ESCHERICHIA COLI  Resp Panel by RT-PCR (Flu A&B, Covid) Nasopharyngeal Swab     Status: None   Collection Time: 10/16/20 12:00 PM   Specimen: Nasopharyngeal Swab; Nasopharyngeal(NP) swabs in vial transport medium  Result Value Ref Range Status   SARS Coronavirus 2 by RT PCR NEGATIVE NEGATIVE Final    Comment: (NOTE) SARS-CoV-2 target nucleic acids are NOT DETECTED.  The SARS-CoV-2 RNA is generally detectable in upper respiratory specimens during the acute phase of infection. The lowest concentration of SARS-CoV-2 viral copies this  assay can detect is 138 copies/mL. A negative result does not preclude SARS-Cov-2 infection and should not be used as the sole basis for treatment or other patient management decisions. A negative result may occur with  improper specimen collection/handling, submission of specimen other than nasopharyngeal swab, presence of viral mutation(s) within the areas targeted by this assay, and inadequate number of viral copies(<138 copies/mL). A negative result must be combined with clinical observations, patient history, and epidemiological information. The expected result is Negative.  Fact Sheet for Patients:  BloggerCourse.com  Fact Sheet for Healthcare Providers:  SeriousBroker.it  This test is no t yet approved or cleared by the [place] FDA and  has been authorized for detection and/or diagnosis of SARS-CoV-2 by FDA under an Emergency Use Authorization (EUA). This EUA will remain  in effect (meaning this test can be  used) for the duration of the COVID-19 declaration under Section 564(b)(1) of the Act, 21 U.S.C.section 360bbb-3(b)(1), unless the authorization is terminated  or revoked sooner.       Influenza A by PCR NEGATIVE NEGATIVE Final   Influenza B by PCR NEGATIVE NEGATIVE Final    Comment: (NOTE) The Xpert Xpress SARS-CoV-2/FLU/RSV plus assay is intended as an aid in the diagnosis of influenza from Nasopharyngeal swab specimens and should not be used as a sole basis for treatment. Nasal washings and aspirates are unacceptable for Xpert Xpress SARS-CoV-2/FLU/RSV testing.  Fact Sheet for Patients: BloggerCourse.com  Fact Sheet for Healthcare Providers: SeriousBroker.it  This test is not yet approved or cleared by the [place] FDA and has been authorized for detection and/or diagnosis of SARS-CoV-2 by FDA under an Emergency Use Authorization (EUA). This EUA will remain in effect (meaning this test can be used) for the duration of the COVID-19 declaration under Section 564(b)(1) of the Act, 21 U.S.C. section 360bbb-3(b)(1), unless the authorization is terminated or revoked.  Performed at [facility], [address] [place], [place] [address]      Labs: BNP (last 3 results) No results for input(s): BNP in the last 8760 hours. Basic Metabolic Panel: Recent Labs  Lab 10/18/20 0746  NA 135  K 4.0  CL 105  CO2 21*  GLUCOSE 87  BUN 10  CREATININE 0.83  CALCIUM 8.6*   Liver Function Tests: No results for input(s): AST, ALT, ALKPHOS, BILITOT, PROT, ALBUMIN in the last 168 hours. No results for input(s): LIPASE, AMYLASE in the last 168 hours. No results for input(s): AMMONIA in the last 168 hours. CBC: No results for input(s): WBC, NEUTROABS, HGB, HCT, MCV, PLT in the last 168 hours. Cardiac Enzymes: No results for input(s): CKTOTAL, CKMB, CKMBINDEX, TROPONINI in the last 168 hours. BNP: Invalid input(s):  POCBNP CBG: No results for input(s): GLUCAP in the last 168 hours. D-Dimer No results for input(s): DDIMER in the last 72 hours. Hgb A1c No results for input(s): HGBA1C in the last 72 hours. Lipid Profile No results for input(s): CHOL, HDL, LDLCALC, TRIG, CHOLHDL, LDLDIRECT in the last 72 hours. Thyroid function studies No results for input(s): TSH, T4TOTAL, T3FREE, THYROIDAB in the last 72 hours.  Invalid input(s): FREET3 Anemia work up No results for input(s): VITAMINB12, FOLATE, FERRITIN, TIBC, IRON, RETICCTPCT in the last 72 hours. Urinalysis    Component Value Date/Time   COLORURINE YELLOW (A) 10/13/2020 1346   APPEARANCEUR CLOUDY (A) 10/13/2020 1346   LABSPEC 1.011 10/13/2020 1346   PHURINE 8.0 10/13/2020 1346   GLUCOSEU NEGATIVE 10/13/2020 1346   HGBUR NEGATIVE 10/13/2020 1346  BILIRUBINUR NEGATIVE 10/13/2020 1346   KETONESUR NEGATIVE 10/13/2020 1346   PROTEINUR NEGATIVE 10/13/2020 1346   NITRITE NEGATIVE 10/13/2020 1346   LEUKOCYTESUR LARGE (A) 10/13/2020 1346   Sepsis Labs Invalid input(s): PROCALCITONIN,  WBC,  LACTICIDVEN Microbiology Recent Results (from the past 240 hour(s))  Resp Panel by RT-PCR (Flu A&B, Covid) Nasopharyngeal Swab     Status: None   Collection Time: 10/13/20  1:46 PM   Specimen: Nasopharyngeal Swab; Nasopharyngeal(NP) swabs in vial transport medium  Result Value Ref Range Status   SARS Coronavirus 2 by RT PCR NEGATIVE NEGATIVE Final    Comment: (NOTE) SARS-CoV-2 target nucleic acids are NOT DETECTED.  The SARS-CoV-2 RNA is generally detectable in upper respiratory specimens during the acute phase of infection. The lowest concentration of SARS-CoV-2 viral copies this assay can detect is 138 copies/mL. A negative result does not preclude SARS-Cov-2 infection and should not be used as the sole basis for treatment or other patient management decisions. A negative result may occur with  improper specimen collection/handling, submission of  specimen other than nasopharyngeal swab, presence of viral mutation(s) within the areas targeted by this assay, and inadequate number of viral copies(<138 copies/mL). A negative result must be combined with clinical observations, patient history, and epidemiological information. The expected result is Negative.  Fact Sheet for Patients:  BloggerCourse.com  Fact Sheet for Healthcare Providers:  SeriousBroker.it  This test is no t yet approved or cleared by the [place] FDA and  has been authorized for detection and/or diagnosis of SARS-CoV-2 by FDA under an Emergency Use Authorization (EUA). This EUA will remain  in effect (meaning this test can be used) for the duration of the COVID-19 declaration under Section 564(b)(1) of the Act, 21 U.S.C.section 360bbb-3(b)(1), unless the authorization is terminated  or revoked sooner.       Influenza A by PCR NEGATIVE NEGATIVE Final   Influenza B by PCR NEGATIVE NEGATIVE Final    Comment: (NOTE) The Xpert Xpress SARS-CoV-2/FLU/RSV plus assay is intended as an aid in the diagnosis of influenza from Nasopharyngeal swab specimens and should not be used as a sole basis for treatment. Nasal washings and aspirates are unacceptable for Xpert Xpress SARS-CoV-2/FLU/RSV testing.  Fact Sheet for Patients: BloggerCourse.com  Fact Sheet for Healthcare Providers: SeriousBroker.it  This test is not yet approved or cleared by the [place] FDA and has been authorized for detection and/or diagnosis of SARS-CoV-2 by FDA under an Emergency Use Authorization (EUA). This EUA will remain in effect (meaning this test can be used) for the duration of the COVID-19 declaration under Section 564(b)(1) of the Act, 21 U.S.C. section 360bbb-3(b)(1), unless the authorization is terminated or revoked.  Performed at [facility], [address] [place], [place] [address]   Urine culture     Status: Abnormal   Collection Time: 10/13/20  1:46 PM   Specimen: Urine, Random  Result Value Ref Range Status   Specimen Description   Final    URINE, RANDOM Performed at [facility], [address], [place], [place] [address]    Special Requests   Final    NONE Performed at [facility], [address] [place], [place] [address]    Culture >=100,000 COLONIES/mL ESCHERICHIA COLI (A)  Final   Report Status 10/16/2020 FINAL  Final   Organism ID, Bacteria ESCHERICHIA COLI (A)  Final      Susceptibility   Escherichia coli - MIC*    AMPICILLIN >=32 RESISTANT Resistant  CEFAZOLIN <=4 SENSITIVE Sensitive     CEFEPIME <=0.12 SENSITIVE Sensitive     CEFTRIAXONE <=0.25 SENSITIVE Sensitive     CIPROFLOXACIN >=4 RESISTANT Resistant     GENTAMICIN <=1 SENSITIVE Sensitive     IMIPENEM <=0.25 SENSITIVE Sensitive     NITROFURANTOIN <=16 SENSITIVE Sensitive     TRIMETH/SULFA <=20 SENSITIVE Sensitive     AMPICILLIN/SULBACTAM 16 INTERMEDIATE Intermediate     PIP/TAZO <=4 SENSITIVE Sensitive     * >=100,000 COLONIES/mL ESCHERICHIA COLI  Resp Panel by RT-PCR (Flu A&B, Covid) Nasopharyngeal Swab     Status: None   Collection Time: 10/16/20 12:00 PM   Specimen: Nasopharyngeal Swab; Nasopharyngeal(NP) swabs in vial transport medium  Result Value Ref Range Status   SARS Coronavirus 2 by RT PCR NEGATIVE NEGATIVE Final    Comment: (NOTE) SARS-CoV-2 target nucleic acids are NOT DETECTED.  The SARS-CoV-2 RNA is generally detectable in upper respiratory specimens during the acute phase of infection. The lowest concentration of SARS-CoV-2 viral copies this assay can detect is 138 copies/mL. A negative result does not preclude SARS-Cov-2 infection and should not be used as the sole basis for treatment or other patient management decisions. A negative result may occur with  improper specimen collection/handling, submission of  specimen other than nasopharyngeal swab, presence of viral mutation(s) within the areas targeted by this assay, and inadequate number of viral copies(<138 copies/mL). A negative result must be combined with clinical observations, patient history, and epidemiological information. The expected result is Negative.  Fact Sheet for Patients:  BloggerCourse.com  Fact Sheet for Healthcare Providers:  SeriousBroker.it  This test is no t yet approved or cleared by the [place] FDA and  has been authorized for detection and/or diagnosis of SARS-CoV-2 by FDA under an Emergency Use Authorization (EUA). This EUA will remain  in effect (meaning this test can be used) for the duration of the COVID-19 declaration under Section 564(b)(1) of the Act, 21 U.S.C.section 360bbb-3(b)(1), unless the authorization is terminated  or revoked sooner.       Influenza A by PCR NEGATIVE NEGATIVE Final   Influenza B by PCR NEGATIVE NEGATIVE Final    Comment: (NOTE) The Xpert Xpress SARS-CoV-2/FLU/RSV plus assay is intended as an aid in the diagnosis of influenza from Nasopharyngeal swab specimens and should not be used as a sole basis for treatment. Nasal washings and aspirates are unacceptable for Xpert Xpress SARS-CoV-2/FLU/RSV testing.  Fact Sheet for Patients: BloggerCourse.com  Fact Sheet for Healthcare Providers: SeriousBroker.it  This test is not yet approved or cleared by the [place] FDA and has been authorized for detection and/or diagnosis of SARS-CoV-2 by FDA under an Emergency Use Authorization (EUA). This EUA will remain in effect (meaning this test can be used) for the duration of the COVID-19 declaration under Section 564(b)(1) of the Act, 21 U.S.C. section 360bbb-3(b)(1), unless the authorization is terminated or revoked.  Performed at [facility], [address] [place], [place] [address]      Time coordinating discharge: 40 minutes  SIGNED:   Alba Cory, MD  [facility]

## 2020-10-21 NOTE — TOC Transition Note (Signed)
Transition of Care [facility]) - CM/SW Discharge Note   Patient Details  Name: MICK TANGUMA MRN: [id number] Date of Birth: [birth year]  Transition of Care [facility]) CM/SW Contact:  Hetty Ely, RN Phone Number: 10/21/2020, 3:26 PM   Clinical Narrative:  [facility] authorization done, however patient with no family local that can sign paperwork, so Debbie from [employer] paperwork required to daughter Leeroy Bock who is in [place]. Will probably discharge tomorrow if not received by 4pm.      Barriers to Discharge: Insurance Authorization   Patient Goals and CMS Choice Patient states their goals for this hospitalization and ongoing recovery are:: Patient wants to go home but very confused- daughter agrees with SNF CMS Medicare.gov Compare Post Acute Care list provided to:: Patient Represenative (must comment) Choice offered to / list presented to : Adult Children  Discharge Placement                       Discharge Plan and Services   Discharge Planning Services: CM Consult Post Acute Care Choice: Skilled Nursing Facility          DME Arranged: N/A DME Agency: NA                  Social Determinants of Health (SDOH) Interventions     Readmission Risk Interventions No flowsheet data found.

## 2020-10-22 LAB — RESP PANEL BY RT-PCR (FLU A&B, COVID) ARPGX2
Influenza A by PCR: NEGATIVE
Influenza B by PCR: NEGATIVE
SARS Coronavirus 2 by RT PCR: NEGATIVE

## 2020-10-22 LAB — VITAMIN B1: Vitamin B1 (Thiamine): 214.2 nmol/L — ABNORMAL HIGH (ref 66.5–200.0)

## 2020-10-22 NOTE — TOC Transition Note (Signed)
Transition of Care [facility]) - CM/SW Discharge Note   Patient Details  Name: Brandon [name] MRN: [id number] Date of Birth: 08-03-1950  Transition of Care [facility]) CM/SW Contact:  Allayne Butcher, RN Phone Number: 10/22/2020, 2:11 PM   Clinical Narrative:    Patient medically cleared for discharge to [facility].  Daughter has faxed signed admission papers to facility.  Bedside RN will call report and RNCM has arranged transport via [facility].  Patient is 3rd on the list for pick up.    Final next level of care: Skilled Nursing Facility Barriers to Discharge: Barriers Resolved   Patient Goals and CMS Choice Patient states their goals for this hospitalization and ongoing recovery are:: Patient wants to go home but very confused- daughter agrees with SNF CMS Medicare.gov Compare Post Acute Care list provided to:: Patient Represenative (must comment) Choice offered to / list presented to : Adult Children  Discharge Placement              Patient chooses bed at: [facility] Patient to be transferred to facility by: [facility] Name of family member notified: [name] Patient and family notified of of transfer: 10/22/20  Discharge Plan and Services   Discharge Planning Services: CM Consult Post Acute Care Choice: Skilled Nursing Facility          DME Arranged: N/A DME Agency: NA                  Social Determinants of Health (SDOH) Interventions     Readmission Risk Interventions No flowsheet data found.

## 2020-10-22 NOTE — Plan of Care (Signed)
Patient seen and rounded on this morning. No change to DC summary.  Patient anticipating discharging to SNF today. No acute issues to address.  Lewie Chamber, MD [facility] 10/22/2020, 5:12 PM

## 2020-10-22 NOTE — Progress Notes (Signed)
Occupational Therapy Treatment Patient Details Name: Brandon [name] MRN: [id number] DOB: [birth year] Today's Date: 10/22/2020    History of present illness The pt is a 70 y.o. 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  Patient was brought in by EMS after daughter had expressed concern with patient being altered mental status from his baseline.   OT comments  Pt seen for OT treatment on this date. Upon arrival to room, pt awake and seated upright in bed. Pt A&Ox 2 reporting no pain. Pt agreeable to tx. Pt continues to present with decreased balance and strength, and this date requires MAX A for sit>stand LB dressing, MOD A for stand pivot transfers, and SET-UP assist for seated grooming tasks. Pt is making good progress toward goals and continues to benefit from skilled OT services to maximize return to PLOF and minimize risk of future falls, injury, caregiver burden, and readmission. Will continue to follow POC. Discharge recommendation remains appropriate.    Follow Up Recommendations  SNF    Equipment Recommendations  Other (comment) (defer to next venue of care)       Precautions / Restrictions Precautions Precautions: Fall Restrictions Weight Bearing Restrictions: No       Mobility Bed Mobility Overal bed mobility: Needs Assistance Bed Mobility: Supine to Sit     Supine to sit: Min guard     General bed mobility comments: With use of bed rails and HOB slightly elevated, able to complete MIN GUARD with increased time/effort    Transfers Overall transfer level: Needs assistance Equipment used: Rolling walker (2 wheeled);1 person hand held assist Transfers: Sit to/from [employer] Sit to Stand: Mod assist Stand pivot transfers: Mod assist       General transfer comment: Unilateral UE support from RW for standing balance during sit>stand LB dressing.  Required unilateral UE support via 1-person HHA during stand pivot transfer    Balance Overall balance assessment: Needs assistance Sitting-balance support: Feet supported;Single extremity supported Sitting balance-Leahy Scale: Good Sitting balance - Comments: Good sitting balance while bending over and attempting to thread underwear over feet   Standing balance support: Single extremity supported;During functional activity Standing balance-Leahy Scale: Poor Standing balance comment: reliant on UE support and external MOD A for steadying during LB dressing                           ADL either performed or assessed with clinical judgement   ADL Overall ADL's : Needs assistance/impaired     Grooming: Brushing hair;Supervision/safety;Set up;Sitting               Lower Body Dressing: Maximal assistance;Sit to/from stand Lower Body Dressing Details (indicate cue type and reason): MAX A to don socks, don shoes, thread underwear through feet, and steady while pt pulled underwear from calves to hips sit>stand.             Functional mobility during ADLs: Moderate assistance;Rolling walker (MOD A for stand pivot transfers)                 Cognition Arousal/Alertness: Awake/alert Behavior During Therapy: WFL for tasks assessed/performed;Impulsive Overall Cognitive Status: History of cognitive impairments - at baseline                                 General Comments: Pt  A&Ox2 (self and place). Pleasant and agreeable throughout                   Pertinent Vitals/ Pain       Pain Assessment: No/denies pain     Prior Functioning/Environment              Frequency  Min 1X/week        Progress Toward Goals  OT Goals(current goals can now be found in the care plan section)  Progress towards OT goals: Progressing toward goals  Acute Rehab OT Goals Patient Stated Goal: to get out of hospital OT Goal Formulation: With patient Time  For Goal Achievement: 10/29/20 Potential to Achieve Goals: Good  Plan Discharge plan remains appropriate;Frequency remains appropriate       AM-PAC OT "6 Clicks" Daily Activity     Outcome Measure   Help from another person eating meals?: None Help from another person taking care of personal grooming?: A Little Help from another person toileting, which includes using toliet, bedpan, or urinal?: A Lot Help from another person bathing (including washing, rinsing, drying)?: A Lot Help from another person to put on and taking off regular upper body clothing?: A Little Help from another person to put on and taking off regular lower body clothing?: A Lot 6 Click Score: 16    End of Session Equipment Utilized During Treatment: Gait belt;Rolling walker  OT Visit Diagnosis: Unsteadiness on feet (R26.81);Muscle weakness (generalized) (M62.81);Other symptoms and signs involving cognitive function   Activity Tolerance Patient tolerated treatment well   Patient Left in chair;with call bell/phone within reach;with chair alarm set   Nurse Communication Mobility status        Time: 5638-7564 OT Time Calculation (min): 26 min  Charges: OT General Charges $OT Visit: 1 Visit OT Treatments $Self Care/Home Management : 23-37 mins  Matthew Folks, OTR/L [facility] [phone]

## 2022-11-04 ENCOUNTER — Encounter (INDEPENDENT_AMBULATORY_CARE_PROVIDER_SITE_OTHER): Payer: Medicare Other | Admitting: Vascular Surgery

## 2022-12-15 ENCOUNTER — Other Ambulatory Visit: Payer: Self-pay

## 2022-12-15 ENCOUNTER — Emergency Department: Payer: Medicare Other

## 2022-12-15 ENCOUNTER — Emergency Department
Admission: EM | Admit: 2022-12-15 | Discharge: 2022-12-15 | Disposition: A | Discharge status: Home / Self Care | Payer: Medicare Other | Attending: Emergency Medicine | Admitting: Emergency Medicine

## 2022-12-15 DIAGNOSIS — M25551 Pain in right hip: Secondary | ICD-10-CM | POA: Insufficient documentation

## 2022-12-15 DIAGNOSIS — W19XXXA Unspecified fall, initial encounter: Secondary | ICD-10-CM | POA: Insufficient documentation

## 2022-12-15 DIAGNOSIS — M25511 Pain in right shoulder: Secondary | ICD-10-CM | POA: Diagnosis not present

## 2022-12-15 LAB — BASIC METABOLIC PANEL
Anion gap: 8 (ref 5–15)
BUN: 15 mg/dL (ref 8–23)
CO2: 22 mmol/L (ref 22–32)
Calcium: 8.5 mg/dL — ABNORMAL LOW (ref 8.9–10.3)
Chloride: 104 mmol/L (ref 98–111)
Creatinine, Ser: 1.01 mg/dL (ref 0.61–1.24)
GFR, Estimated: >60 mL/min (ref >60)
Glucose, Bld: 86 mg/dL (ref 70–99)
Potassium: 3.7 mmol/L (ref 3.5–5.1)
Sodium: 134 mmol/L — ABNORMAL LOW (ref 135–145)

## 2022-12-15 LAB — CBC WITH DIFFERENTIAL/PLATELET
Abs Immature Granulocytes: 0.01 10*3/uL (ref 0.00–0.07)
Basophils Absolute: 0 10*3/uL (ref 0.0–0.1)
Basophils Relative: 0 %
Eosinophils Absolute: 0.1 10*3/uL (ref 0.0–0.5)
Eosinophils Relative: 2 %
HCT: 41.5 % (ref 39.0–52.0)
Hemoglobin: 13 g/dL (ref 13.0–17.0)
Immature Granulocytes: 0 %
Lymphocytes Relative: 43 %
Lymphs Abs: 1.6 10*3/uL (ref 0.7–4.0)
MCH: 28.3 pg (ref 26.0–34.0)
MCHC: 31.3 g/dL (ref 30.0–36.0)
MCV: 90.4 fL (ref 80.0–100.0)
Monocytes Absolute: 0.7 10*3/uL (ref 0.1–1.0)
Monocytes Relative: 18 %
Neutro Abs: 1.4 10*3/uL — ABNORMAL LOW (ref 1.7–7.7)
Neutrophils Relative %: 37 %
Platelets: 126 10*3/uL — ABNORMAL LOW (ref 150–400)
RBC: 4.59 MIL/uL (ref 4.22–5.81)
RDW: 13.6 % (ref 11.5–15.5)
WBC: 3.7 10*3/uL — ABNORMAL LOW (ref 4.0–10.5)
nRBC: 0 % (ref 0.0–0.2)

## 2022-12-15 LAB — CK: Total CK: 125 U/L (ref 49–397)

## 2022-12-15 NOTE — ED Triage Notes (Signed)
Pt to ED via EMS from [facility], pt had mechanical fall and states he was laying on the floor for 2 hours. Pt landed on right side, pt was c/o right hip and shoulder pain but states pain is a little bit better at this time. Pt denies hitting head, not on blood thinners.

## 2022-12-15 NOTE — ED Notes (Signed)
Pt given meal tray and drink at this time 

## 2022-12-15 NOTE — ED Provider Notes (Addendum)
[facility] Provider Note    Event Date/Time   First MD Initiated Contact with Patient 12/15/22 [phone]     (approximate)   History   Fall   HPI Brandon [name] is a 72 y.o. male who presents by EMS from [facility] for evaluation after a fall.  Patient states that he got up and was walking and then slipped and fell onto his right side.  He could not get back up so he believes he stayed on the floor for about 2 hours until someone checked on him and found him.  He was having pain in his right hip and right shoulder although both feel better now.  He denies chest pain, shortness of breath, headache, neck pain, nausea, vomiting, and abdominal pain.  He said he felt fine before the fall.     Physical Exam   Triage Vital Signs: ED Triage Vitals  Encounter Vitals Group     BP 12/15/22 0450 134/72     Systolic BP Percentile --      Diastolic BP Percentile --      Pulse Rate 12/15/22 0450 68     Resp 12/15/22 0450 18     Temp 12/15/22 0450 (!) 97.5 F (36.4 C)     Temp Source 12/15/22 0450 Oral     SpO2 12/15/22 0450 97 %     Weight 12/15/22 0448 78.9 kg (174 lb)     Height 12/15/22 0448 1.803 m (5\' 11" )     Head Circumference --      Peak Flow --      Pain Score 12/15/22 0448 3     Pain Loc --      Pain Education --      Exclude from Growth Chart --     Most recent vital signs: Vitals:   12/15/22 0450  BP: 134/72  Pulse: 68  Resp: 18  Temp: (!) 97.5 F (36.4 C)  SpO2: 97%    General: Awake, no distress.  CV:  Good peripheral perfusion.  Resp:  Normal effort. Speaking easily and comfortably, no accessory muscle usage nor intercostal retractions.   Abd:  No distention.  No tenderness to palpation. Other:  Patient has chronic lymphedema/peripheral edema with skin thickening, equal bilaterally.  He is moving all of his extremities.  He moans a little bit and starts to jump when attempts at passive range of motion are made, but we verify that he  has some chronic right-sided deficits and limited range of motion.  No gross deformities suggestive of acute traumatic injury.    ED Results / Procedures / Treatments   Labs (all labs ordered are listed, but only abnormal results are displayed) Labs Reviewed  CBC WITH DIFFERENTIAL/PLATELET - Abnormal; Notable for the following components:      Result Value   WBC 3.7 (*)    Platelets 126 (*)    Neutro Abs 1.4 (*)    All other components within normal limits  BASIC METABOLIC PANEL - Abnormal; Notable for the following components:   Sodium 134 (*)    Calcium 8.5 (*)    All other components within normal limits  CK     EKG  ED ECG REPORT I, Loleta Rose, the attending physician, personally viewed and interpreted this ECG.  Date: 12/15/2022 EKG Time: 4:49 AM Rate: 65 Rhythm: normal sinus rhythm QRS Axis: normal Intervals: normal ST/T Wave abnormalities: Non-specific ST segment / T-wave changes, but no clear evidence of  acute ischemia. Narrative Interpretation: no definitive evidence of acute ischemia; does not meet STEMI criteria.    RADIOLOGY I viewed and interpreted the patient's right shoulder x-rays and right hip/pelvis x-rays.  No obvious fracture or dislocation.   PROCEDURES:  Critical Care performed: No  .1-3 Lead EKG Interpretation  Performed by: Loleta Rose, MD Authorized by: Loleta Rose, MD     Interpretation: normal     ECG rate:  65   ECG rate assessment: normal     Rhythm: sinus rhythm     Ectopy: none     Conduction: normal       IMPRESSION / MDM / ASSESSMENT AND PLAN / ED COURSE  I reviewed the triage vital signs and the nursing notes.                              Differential diagnosis includes, but is not limited to, fracture, dislocation, electrolyte or metabolic abnormality including possible renal dysfunction.  Infection is possible but less likely.  Patient's presentation is most consistent with acute presentation with potential  threat to life or bodily function.  Labs/studies ordered: CBC with differential, CK, basic metabolic panel, hip x-rays (right) pelvic x-ray, right shoulder x-rays.  EKG  Interventions/Medications given:  Medications - No data to display  (Note:  hospital course my include additional interventions and/or labs/studies not listed above.)   Patient is generally well, awake and alert and oriented.  The patient is on the cardiac monitor to evaluate for evidence of arrhythmia and/or significant heart rate changes.  Doubt severe injury or infectious process but we will further investigate with imaging and labs.   Clinical Course as of 12/15/22 0649  Wed Dec 15, 2022  6578 Patient in no distress, eating.  We verified with the skilled nursing facility that the patient has dementia and does not ambulate at baseline and also has some chronic right-sided deficits.  He seems to be at his baseline with no evidence of emergent or acute injury and he is appropriate is stable for discharge back to [facility] [CF]    Clinical Course User Index [CF] Loleta Rose, MD     FINAL CLINICAL IMPRESSION(S) / ED DIAGNOSES   Final diagnoses:  Fall, initial encounter     Rx / DC Orders   ED Discharge Orders     None        Note:  This document was prepared using Dragon voice recognition software and may include unintentional dictation errors.   Loleta Rose, MD 12/15/22 4696    Loleta Rose, MD 12/15/22 2952    Loleta Rose, MD 12/15/22 [phone]

## 2022-12-15 NOTE — Discharge Instructions (Signed)

## 2022-12-20 ENCOUNTER — Encounter (INDEPENDENT_AMBULATORY_CARE_PROVIDER_SITE_OTHER): Payer: Medicare Other | Admitting: Vascular Surgery

## 2023-05-10 ENCOUNTER — Emergency Department: Payer: Medicare Other

## 2023-05-10 ENCOUNTER — Emergency Department
Admission: EM | Admit: 2023-05-10 | Discharge: 2023-05-10 | Disposition: A | Discharge status: Home / Self Care | Payer: Medicare Other | Attending: Emergency Medicine | Admitting: Emergency Medicine

## 2023-05-10 DIAGNOSIS — I10 Essential (primary) hypertension: Secondary | ICD-10-CM | POA: Diagnosis not present

## 2023-05-10 DIAGNOSIS — Z1152 Encounter for screening for COVID-19: Secondary | ICD-10-CM | POA: Insufficient documentation

## 2023-05-10 DIAGNOSIS — M791 Myalgia, unspecified site: Secondary | ICD-10-CM | POA: Diagnosis present

## 2023-05-10 DIAGNOSIS — D72829 Elevated white blood cell count, unspecified: Secondary | ICD-10-CM | POA: Diagnosis not present

## 2023-05-10 DIAGNOSIS — F0390 Unspecified dementia without behavioral disturbance: Secondary | ICD-10-CM | POA: Diagnosis not present

## 2023-05-10 DIAGNOSIS — N390 Urinary tract infection, site not specified: Secondary | ICD-10-CM | POA: Diagnosis not present

## 2023-05-10 DIAGNOSIS — Z79899 Other long term (current) drug therapy: Secondary | ICD-10-CM | POA: Insufficient documentation

## 2023-05-10 LAB — URINALYSIS, W/ REFLEX TO CULTURE (INFECTION SUSPECTED)
Bilirubin Urine: NEGATIVE
Glucose, UA: NEGATIVE mg/dL
Ketones, ur: NEGATIVE mg/dL
Nitrite: NEGATIVE
Protein, ur: 30 mg/dL — AB
Specific Gravity, Urine: 1.02 (ref 1.005–1.030)
WBC, UA: >50 WBC/hpf (ref 0–5)
pH: 5 (ref 5.0–8.0)

## 2023-05-10 LAB — CBC WITH DIFFERENTIAL/PLATELET
Abs Immature Granulocytes: 0.21 10*3/uL — ABNORMAL HIGH (ref 0.00–0.07)
Basophils Absolute: 0 10*3/uL (ref 0.0–0.1)
Basophils Relative: 0 %
Eosinophils Absolute: 0.1 10*3/uL (ref 0.0–0.5)
Eosinophils Relative: 1 %
HCT: 38.3 % — ABNORMAL LOW (ref 39.0–52.0)
Hemoglobin: 12.2 g/dL — ABNORMAL LOW (ref 13.0–17.0)
Immature Granulocytes: 2 %
Lymphocytes Relative: 10 %
Lymphs Abs: 1.4 10*3/uL (ref 0.7–4.0)
MCH: 27.7 pg (ref 26.0–34.0)
MCHC: 31.9 g/dL (ref 30.0–36.0)
MCV: 86.8 fL (ref 80.0–100.0)
Monocytes Absolute: 2.5 10*3/uL — ABNORMAL HIGH (ref 0.1–1.0)
Monocytes Relative: 18 %
Neutro Abs: 9.5 10*3/uL — ABNORMAL HIGH (ref 1.7–7.7)
Neutrophils Relative %: 69 %
Platelets: 198 10*3/uL (ref 150–400)
RBC: 4.41 MIL/uL (ref 4.22–5.81)
RDW: 13.9 % (ref 11.5–15.5)
WBC: 13.6 10*3/uL — ABNORMAL HIGH (ref 4.0–10.5)
nRBC: 0 % (ref 0.0–0.2)

## 2023-05-10 LAB — BASIC METABOLIC PANEL
Anion gap: 10 (ref 5–15)
BUN: 21 mg/dL (ref 8–23)
CO2: 23 mmol/L (ref 22–32)
Calcium: 7.9 mg/dL — ABNORMAL LOW (ref 8.9–10.3)
Chloride: 106 mmol/L (ref 98–111)
Creatinine, Ser: 0.98 mg/dL (ref 0.61–1.24)
GFR, Estimated: >60 mL/min (ref >60)
Glucose, Bld: 113 mg/dL — ABNORMAL HIGH (ref 70–99)
Potassium: 3.6 mmol/L (ref 3.5–5.1)
Sodium: 139 mmol/L (ref 135–145)

## 2023-05-10 LAB — RESP PANEL BY RT-PCR (RSV, FLU A&B, COVID)  RVPGX2
Influenza A by PCR: NEGATIVE
Influenza B by PCR: NEGATIVE
Resp Syncytial Virus by PCR: NEGATIVE
SARS Coronavirus 2 by RT PCR: NEGATIVE

## 2023-05-10 LAB — LACTIC ACID, PLASMA: Lactic Acid, Venous: 1.8 mmol/L (ref 0.5–1.9)

## 2023-05-10 LAB — TROPONIN I (HIGH SENSITIVITY)
Troponin I (High Sensitivity): 14 ng/L (ref <18)
Troponin I (High Sensitivity): 10 ng/L (ref <18)

## 2023-05-10 MED ORDER — SODIUM CHLORIDE 0.9 % IV SOLN
1.0000 g | Freq: Once | INTRAVENOUS | Status: AC
  Administered 2023-05-10: 1 g via INTRAVENOUS
  Filled 2023-05-10: qty 10

## 2023-05-10 MED ORDER — SODIUM CHLORIDE 0.9 % IV SOLN
Freq: Once | INTRAVENOUS | Status: AC
Start: 2023-05-10 — End: 2023-05-10

## 2023-05-10 MED ORDER — CEPHALEXIN 500 MG PO CAPS: 500.0000 mg | ORAL_CAPSULE | Freq: Three times a day (TID) | ORAL | 0 refills | Status: AC

## 2023-05-10 NOTE — ED Provider Notes (Signed)
[facility] Provider Note    Event Date/Time   First MD Initiated Contact with Patient 05/10/23 1503     (approximate)   History   Body aches   HPI  Brandon [name] is a 72 y.o. male with a history of dementia, hypertension, CVA who presents with complaints of diffuse bodyaches, was referred in for evaluation by facility.  Patient is unable to provide significant history although appears fairly comfortable here     Physical Exam   Triage Vital Signs: ED Triage Vitals [05/10/23 1118]  Encounter Vitals Group     BP (!) 146/71     Systolic BP Percentile      Diastolic BP Percentile      Pulse Rate 80     Resp 16     Temp 98.6 F (37 C)     Temp Source Oral     SpO2 96 %     Weight      Height      Head Circumference      Peak Flow      Pain Score 8     Pain Loc      Pain Education      Exclude from Growth Chart     Most recent vital signs: Vitals:   05/10/23 1820 05/10/23 1831  BP: (!) 145/111 131/65  Pulse: 98   Resp: 20   Temp:    SpO2: 100%      General: Awake, no distress.  CV:  Good peripheral perfusion.  Resp:  Normal effort.  Abd:  No distention.  Other:  Chronic right-sided deficits,  chronic lower extremity edema   ED Results / Procedures / Treatments   Labs (all labs ordered are listed, but only abnormal results are displayed) Labs Reviewed  BASIC METABOLIC PANEL - Abnormal; Notable for the following components:      Result Value   Glucose, Bld 113 (*)    Calcium 7.9 (*)    All other components within normal limits  CBC WITH DIFFERENTIAL/PLATELET - Abnormal; Notable for the following components:   WBC 13.6 (*)    Hemoglobin 12.2 (*)    HCT 38.3 (*)    Neutro Abs 9.5 (*)    Monocytes Absolute 2.5 (*)    Abs Immature Granulocytes 0.21 (*)    All other components within normal limits  URINALYSIS, W/ REFLEX TO CULTURE (INFECTION SUSPECTED) - Abnormal; Notable for the following components:   Color, Urine AMBER  (*)    APPearance CLOUDY (*)    Hgb urine dipstick LARGE (*)    Protein, ur 30 (*)    Leukocytes,Ua LARGE (*)    Bacteria, UA MANY (*)    All other components within normal limits  RESP PANEL BY RT-PCR (RSV, FLU A&B, COVID)  [id number]  URINE CULTURE  LACTIC ACID, PLASMA  TROPONIN I (HIGH SENSITIVITY)  TROPONIN I (HIGH SENSITIVITY)     EKG  ED ECG REPORT I, Jene Every, the attending physician, personally viewed and interpreted this ECG.  Date: 05/10/2023  Rhythm: normal sinus rhythm QRS Axis: normal Intervals: normal ST/T Wave abnormalities: normal Narrative Interpretation: no evidence of acute ischemia    RADIOLOGY Chest x-ray viewed interpreted me, no acute abnormality    PROCEDURES:  Critical Care performed:   Procedures   MEDICATIONS ORDERED IN ED: Medications  0.9 %  sodium chloride infusion ( Intravenous New Bag/Given 05/10/23 1550)  cefTRIAXone (ROCEPHIN) 1 g in sodium chloride 0.9 % 100 mL  IVPB (1 g Intravenous New Bag/Given 05/10/23 1830)     IMPRESSION / MDM / ASSESSMENT AND PLAN / ED COURSE  I reviewed the triage vital signs and the nursing notes. Patient's presentation is most consistent with acute presentation with potential threat to life or bodily function.  Patient presents with bodyaches as detailed above, there may have been concerned about infection as well although his heart rate seems to have normalized here.  We will obtain labs, give IV fluids as he appears slightly dehydrated check urine chest x-ray lactic and reevaluate  Provider in triage ordered CT cervical spine head hip and lumbar spine which are reassuring, chest x-ray and knee x-rays demonstrate arthritis but otherwise no acute abnormalities.  Lab work notable for mildly elevated white blood cell count, normal lactic, negative COVID.  Urinalysis consistent with infection, treated with IV Rocephin here, appropriate for discharge with p.o. antibiotics        FINAL CLINICAL  IMPRESSION(S) / ED DIAGNOSES   Final diagnoses:  Lower urinary tract infectious disease     Rx / DC Orders   ED Discharge Orders          Ordered    cephALEXin (KEFLEX) 500 MG capsule  3 times daily        05/10/23 1825             Note:  This document was prepared using Dragon voice recognition software and may include unintentional dictation errors.   Jene Every, MD 05/10/23 [phone]

## 2023-05-10 NOTE — Discharge Instructions (Addendum)
Patient's workup was overall reassuring, he had extensive imaging done without evidence of acute injury.  Lab work overall reassuring, urinalysis consistent with UTI, lactic acid normal.  Given IV antibiotics here, started on Keflex.  Please have him follow-up with PCP

## 2023-05-10 NOTE — ED Notes (Signed)
First nurse note: Pt here via [facility] from [facility]: Per EMS pt met sepsis screen.   HR: 108

## 2023-05-10 NOTE — ED Provider Triage Note (Signed)
Emergency Medicine Provider Triage Evaluation Note  Brandon [name] , a 72 y.o. male  was evaluated in triage.  Pt complains of pain all over body, from [facility]. Patient unable to give a history, no report from EMS.  Review of Systems  Positive:  Negative:   Physical Exam  There were no vitals taken for this visit. Gen:   Awake, no distress   Resp:  Normal effort  MSK:   Moves extremities without difficulty  Other:  Seems to have tenderness to palpation over the bilateral knees  Medical Decision Making  Medically screening exam initiated at 11:15 AM.  Appropriate orders placed.  Brandon [name] was informed that the remainder of the evaluation will be completed by another provider, this initial triage assessment does not replace that evaluation, and the importance of remaining in the ED until their evaluation is complete.     Cameron Ali, PA-C 05/10/23 1119

## 2023-05-10 NOTE — ED Notes (Addendum)
Pt to room

## 2023-05-10 NOTE — ED Triage Notes (Signed)
Pt arrived via EMS from [facility] d/t pain. Pt is unable to explain in depth his condition d/t ams.

## 2023-05-13 LAB — URINE CULTURE: Culture: >=100000 — AB

## 2023-05-25 NOTE — Progress Notes (Signed)
 Chief Complaint: Chief Complaint  Patient presents with  . Left Hip - Pain    Reason for Visit: [name] is a 72 y.o. male who presents today via EMS transport for ED follow-up and evaluation of his left hip.  Patient was seen in the emergency department on 05/10/2023 for a urinary issue and was also noted to have left hip discomfort.  A CT of the abdomen and pelvis was ordered which showed lucency and irregularity of the left anterior superior femoral head concerning for avascular necrosis of the left hip.  Patient was given a short course of pain medication and urged to follow-up with orthopedics.  Today, the patient presents via EMS transport from his assisted living facility.  Patient is noted to be confused at baseline with difficulty with coherent communication.  Patient does have a history of dementia secondary to a CVA and Alzheimer's disease.  He has difficulty answering questions about his condition.  EMS informed that he was coming today for left hip pain, but states that they thought they were dropping him off rather than waiting for his entire appointment.  He is currently taking oxycodone , Norco and tramadol  for this discomfort.  Patient grimaces, yells or reports having discomfort with any attempted passive movement of his left hip, most notably with flexion and internal rotation.  Patient is nonambulatory at baseline.  He is a resident at [facility] assisted living in [place].   Medications: Current Outpatient Medications  Medication Sig Dispense Refill  . ammonium lactate (AMLACTIN) 12 % cream     . aspirin  81 MG chewable tablet     . BIOFREEZE, MENTHOL, 4 % Gel     . cholecalciferol (VITAMIN D3) 2,000 unit tablet     . cyanocobalamin  (VITAMIN B12) 1000 MCG tablet Take 1,000 mcg by mouth once daily    . doxazosin  (CARDURA ) 4 MG tablet     . FUROsemide (LASIX) 20 MG tablet     . gabapentin  (NEURONTIN ) 300 MG capsule     . hydrALAZINE  (APRESOLINE ) 10 MG tablet     .  HYDROcodone -acetaminophen  (NORCO) 5-325 mg tablet     . memantine (NAMENDA) 5 MG tablet     . mirtazapine (REMERON) 7.5 MG tablet     . oxyCODONE  (ROXICODONE ) 5 MG immediate release tablet     . traMADoL  (ULTRAM ) 50 mg tablet     . VITAMIN B-1, MONONITRATE, 100 mg Tab tablet     . acetaminophen  (TYLENOL ) 500 MG tablet Take by mouth    . atorvastatin  (LIPITOR) 10 MG tablet Take 10 mg by mouth once daily    . donepeziL (ARICEPT) 10 MG tablet Take 10 mg by mouth every morning     No current facility-administered medications for this visit.    Allergies: No Known Allergies  Past Medical History: Past Medical History:  Diagnosis Date  . Essential hypertension 06/22/2019  . Hemiparesis of right dominant side as late effect of cerebral infarction (CMS/HHS-HCC) 06/22/2019  . Late onset Alzheimer's disease without behavioral disturbance (CMS/HHS-HCC) 06/22/2019    Past Surgical History: History reviewed. No pertinent surgical history.  Social History: Social History   Socioeconomic History  . Marital status: Single    Job: None  Family History: No family history on file.  Review of Systems: A comprehensive 14 point ROS was performed, reviewed, and the pertinent orthopaedic findings are documented in the HPI.  Physical Exam Vitals:   05/23/23 1206  BP: 130/80  Weight: 88.2 kg (194 lb  6.4 oz)   General/Constitutional: The patient appears to be well-nourished, well-developed, and in no acute distress. Neuro/Psych: Normal mood and affect, oriented to person, place and time. Eyes: Non-icteric.  Pupils are equal, round, and reactive to light, and exhibit synchronous movement. Lymphatic: No palpable adenopathy. Respiratory: Normal chest excursion and Non-labored breathing Cardiovascular: No edema, swelling or tenderness, except as noted in detailed exam. Vascular: No edema, swelling or tenderness, except as noted in detailed exam. Integumentary: No impressive skin lesions present,  except as noted in detailed exam. Musculoskeletal: See left hip exam below BP 130/80   Wt 88.2 kg (194 lb 6.4 oz) Comment: Per [facility] note  Left Hip:  Patient presents into the clinic on a stretcher from EMS.  He is nonambulatory at baseline.  The leg lengths seem to be relatively similar with the patient lying.  Any attempted passive or active movement of the left hip seems to generate some discomfort.   Pelvic tilt:  Negative  Soft tissue swelling: Negative  Erythema:  Negative  Crepitance:  Negative  Tenderness:  Greater trochanter is nontender to palpation. Moderate to severe pain is elicited by axial compression or extremes of rotation.  Most notably deep flexion and internal rotation FABER test:  Patient unable to tolerate full testing with increased discomfort  Atrophy:  No atrophy. Poor to fair hip flexor and abductor strength.  Range of Motion: Testing due to patient's mental status as well as inability to stand/move well EXT/FLEX: -/90 passively noted to have great amount of discomfort   ADD/ABD: -/25 passively noted to have a great amount of discomfort   IR/ER: 10/30 passively, noted to have most discomfort with internal rotation  Patient reports having sensation as provider lightly and deeply touches down the leg.  Posterior tibial pulses appreciated.  Imaging: A series of x-rays were ordered and interpreted of the patient's left hip.  These images included AP pelvis, AP and oblique views of the left hip.  Upon inspection of the left hip, there is some noticeable mild to moderate underlying osteoarthritic changes.  More concerning however, there does appear to be some cortical lucency and breakdown appreciated along the superior margin of the femoral head consistent with a potential avascular necrosis.  This was compared with his images and CT taken at the emergency department and found to be in a consistent area.  No noticeable fractures, lytic lesions or gross  deformities appreciated.  Impression: Avascular necrosis of left hip Left hip pain  Plan:   The findings were discussed in detail with the patient. A series of x-rays were ordered and interpreted of the patient's left hip.  These images were reviewed and the findings were discussed with the patient.  They were also reviewed with his CT ordered during his emergency room visit Patient may continue on current pain regimen to help with as needed pain control Patient is not a great candidate for surgical fixation and intervention given his past medical history, weightbearing capabilities and altered mental status Discussed that it may be beneficial to have him follow-up with Dr. Sharrie in the coming weeks to have a hip injection performed.  A referral note has been placed to him to establish a time follow-up. Plan to follow-up with Dr. Kubinski for this injection   SPECIAL EQUIPMENT: None SPECIAL STUDIES: None ACTIVITIES:  As tolerated. WORK STATUS: Not applicable. THERAPY: None ordered today MEDICATIONS: Requested Prescriptions    No prescriptions requested or ordered in this encounter   FOLLOW-UP: No  follow-ups on file.  I personally spent a total of 45 minutes reviewing the patient's chart, interpreting labs/imaging studies, interaction with, and development of a treatment plan during this patient's visit.  Fonda Dallas Koyanagi, [place]  Fonda CHARLENA Koyanagi, PA-C [facility] 05/23/2023 There are other unrelated non-urgent complaints, but due to the busy schedule and the amount of time I've already spent with him, time does not permit me to address these routine issues at today's visit. I've requested another appointment to review these additional issues.

## 2023-05-31 ENCOUNTER — Encounter (INDEPENDENT_AMBULATORY_CARE_PROVIDER_SITE_OTHER): Payer: Self-pay

## 2023-07-02 DEATH — deceased
